# Patient Record
Sex: Female | Born: 2000 | Hispanic: No | Marital: Single | Smoking: Never smoker
Health system: Southern US, Community
[De-identification: ages and names within clinical notes are randomized; demographics above are authoritative.]

## PROBLEM LIST (undated history)

## (undated) DIAGNOSIS — F71 Moderate intellectual disabilities: Secondary | ICD-10-CM

## (undated) DIAGNOSIS — G479 Sleep disorder, unspecified: Secondary | ICD-10-CM

## (undated) DIAGNOSIS — R4689 Other symptoms and signs involving appearance and behavior: Secondary | ICD-10-CM

## (undated) HISTORY — DX: Moderate intellectual disabilities: F71

## (undated) HISTORY — DX: Other symptoms and signs involving appearance and behavior: R46.89

## (undated) HISTORY — DX: Sleep disorder, unspecified: G47.9

---
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
## 2019-01-04 ENCOUNTER — Ambulatory Visit (INDEPENDENT_AMBULATORY_CARE_PROVIDER_SITE_OTHER): Payer: Medicaid Other | Admitting: Pediatrics

## 2019-01-04 ENCOUNTER — Telehealth (INDEPENDENT_AMBULATORY_CARE_PROVIDER_SITE_OTHER): Payer: Self-pay

## 2019-01-04 DIAGNOSIS — R6250 Unspecified lack of expected normal physiological development in childhood: Secondary | ICD-10-CM

## 2019-01-04 DIAGNOSIS — Z8661 Personal history of infections of the central nervous system: Secondary | ICD-10-CM

## 2019-01-04 DIAGNOSIS — Q043 Other reduction deformities of brain: Secondary | ICD-10-CM

## 2019-01-04 DIAGNOSIS — F79 Unspecified intellectual disabilities: Secondary | ICD-10-CM

## 2019-01-04 DIAGNOSIS — G9349 Other encephalopathy: Secondary | ICD-10-CM

## 2019-01-04 NOTE — Telephone Encounter (Signed)
Call to [facility] at [facility]. She reports the only 2 beds Medicaid will cover are the Sleep Safe bed and a hospital bed. She reports to send the order and they will go out to see which mom wants. May not require PT eval if it is a hospital bed.   Fax [phone]

## 2019-01-04 NOTE — Telephone Encounter (Signed)
error 

## 2019-01-19 NOTE — Telephone Encounter (Signed)
Call back to [place] questioned if they are aware of any device to place on a seatbelt to prevent patient from unbuckling it and getting out of car. They reports only device they have is used with their wheelchair. Not aware of anything medicaid covers.  Spoke with Kendrick Fries to confirm receipt of fax related to bed order. She is not aware of receiving the order from 01/04/2019. RN resent order through Epic and will manually fax as well. Kendrick Fries to call RN back if she does not receive the order.

## 2019-02-05 ENCOUNTER — Ambulatory Visit (INDEPENDENT_AMBULATORY_CARE_PROVIDER_SITE_OTHER): Payer: Medicaid Other | Admitting: Pediatrics

## 2019-02-05 ENCOUNTER — Telehealth (INDEPENDENT_AMBULATORY_CARE_PROVIDER_SITE_OTHER): Payer: Self-pay | Admitting: Pediatrics

## 2019-02-05 NOTE — Telephone Encounter (Signed)
°  Who's calling (name and relationship to patient) : Lamar Laundry (Mother)  Best contact number: [phone] Provider they see: Dr. Rogers Blocker Reason for call: I placed call to mom to inform her that we needed to rs pt's appointment with Dr. Rogers Blocker. Appointment needs to be in the office and the patient needs to be with her. The appointment also needs to be a 60 min slot. Please also remind mom that pt needs to come in to have labs drawn before next visit.

## 2019-02-06 ENCOUNTER — Ambulatory Visit: Payer: Medicaid Other

## 2019-02-12 ENCOUNTER — Ambulatory Visit (INDEPENDENT_AMBULATORY_CARE_PROVIDER_SITE_OTHER): Payer: Medicaid Other | Admitting: Pediatrics

## 2019-02-14 ENCOUNTER — Other Ambulatory Visit (INDEPENDENT_AMBULATORY_CARE_PROVIDER_SITE_OTHER): Payer: Self-pay | Admitting: Pediatrics

## 2019-02-14 ENCOUNTER — Ambulatory Visit (INDEPENDENT_AMBULATORY_CARE_PROVIDER_SITE_OTHER): Payer: Medicaid Other | Admitting: Pediatrics

## 2019-02-14 DIAGNOSIS — R6250 Unspecified lack of expected normal physiological development in childhood: Secondary | ICD-10-CM

## 2019-02-20 ENCOUNTER — Telehealth (INDEPENDENT_AMBULATORY_CARE_PROVIDER_SITE_OTHER): Payer: Self-pay | Admitting: Pediatrics

## 2019-02-20 NOTE — Telephone Encounter (Signed)
That is fine to bring Misty [name] for labs before appointment.  Please review location and times of lab.  We did not put a referral in for endocrinology, I wonder if mother is thinking about Dr Henrene Pastor, as she was going to discuss birth control.  Please review with mother and if she wants an endocrinology referral, we will need to discuss that and I put an order in tomorrow.    Carylon Perches MD MPH

## 2019-02-20 NOTE — Telephone Encounter (Signed)
°  Who's calling (name and relationship to patient) : Lamar Laundry (Mother) Best contact number: [phone] Provider they see: Dr. Rogers Blocker  Reason for call: Mother stated she tried to get pt's blood drawn this week but had a hard time. Pt was non-compliant. Mom is going to bring pt to lab on Thursday before appointment. Mom wanted Dr. Rogers Blocker to know that she tried on three different occassions to get pt's blood drawn but it didn't work out. Mom also had questions about the endocrinology referral to see Dr. Baldo Ash as discussed.

## 2019-02-22 ENCOUNTER — Encounter (INDEPENDENT_AMBULATORY_CARE_PROVIDER_SITE_OTHER): Payer: Self-pay | Admitting: Pediatrics

## 2019-02-22 ENCOUNTER — Ambulatory Visit (INDEPENDENT_AMBULATORY_CARE_PROVIDER_SITE_OTHER): Payer: Medicaid Other | Admitting: Pediatrics

## 2019-02-22 ENCOUNTER — Other Ambulatory Visit: Payer: Self-pay

## 2019-02-22 VITALS — HR 88 | Ht 63.25 in | Wt 101.2 lb

## 2019-02-22 DIAGNOSIS — R4689 Other symptoms and signs involving appearance and behavior: Secondary | ICD-10-CM | POA: Diagnosis not present

## 2019-02-22 DIAGNOSIS — R6250 Unspecified lack of expected normal physiological development in childhood: Secondary | ICD-10-CM | POA: Diagnosis not present

## 2019-02-22 DIAGNOSIS — F902 Attention-deficit hyperactivity disorder, combined type: Secondary | ICD-10-CM

## 2019-02-22 DIAGNOSIS — G479 Sleep disorder, unspecified: Secondary | ICD-10-CM | POA: Diagnosis not present

## 2019-02-22 MED ORDER — CYPROHEPTADINE HCL 4 MG PO TABS: 4.0000 mg | ORAL_TABLET | Freq: Three times a day (TID) | ORAL | 0 refills | Status: DC | PRN

## 2019-02-22 MED ORDER — GABAPENTIN 300 MG PO CAPS: 900.0000 mg | ORAL_CAPSULE | Freq: Every day | ORAL | 3 refills | Status: DC

## 2019-02-22 NOTE — Patient Instructions (Addendum)
   Start cyproheptadine 4mg  before lunch, before dinner.   Increase gabapentin to 900mg .  Give at 9pm.   Go to the bed at 9:30-10pm.   Once in her room, she has to stay in her room.    Continue Intuniv 1 tablet in morning and 3 tablets at night.   Continue Abilify 10mg  at night and 2mg  as needed  Continue Fluoxetine 40mg  at night  Wendelyn Breslow will call to discuss high calorie foods.
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
## 2019-02-23 LAB — HEMOGLOBIN A1C
Hgb A1c MFr Bld: 4.8 % of total Hgb (ref <5.7)
Mean Plasma Glucose: 91 (calc)
eAG (mmol/L): 5 (calc)

## 2019-02-23 LAB — VITAMIN D 25 HYDROXY (VIT D DEFICIENCY, FRACTURES): Vit D, 25-Hydroxy: 33 ng/mL (ref 30–100)

## 2019-02-26 ENCOUNTER — Telehealth (INDEPENDENT_AMBULATORY_CARE_PROVIDER_SITE_OTHER): Payer: Self-pay

## 2019-02-26 NOTE — Telephone Encounter (Signed)
Message left for Misty [name] with [employer] to request a different type diaper or pull-up that does not have elastic around the leg. Current brand is rubbing her and causing redness. Her hips measured 34" at last OV

## 2019-02-28 ENCOUNTER — Telehealth (INDEPENDENT_AMBULATORY_CARE_PROVIDER_SITE_OTHER): Payer: Self-pay | Admitting: Pediatrics

## 2019-02-28 DIAGNOSIS — R569 Unspecified convulsions: Secondary | ICD-10-CM

## 2019-02-28 DIAGNOSIS — M4120 Other idiopathic scoliosis, site unspecified: Secondary | ICD-10-CM

## 2019-02-28 DIAGNOSIS — R32 Unspecified urinary incontinence: Secondary | ICD-10-CM

## 2019-02-28 DIAGNOSIS — Q043 Other reduction deformities of brain: Secondary | ICD-10-CM

## 2019-02-28 DIAGNOSIS — G9349 Other encephalopathy: Secondary | ICD-10-CM

## 2019-02-28 DIAGNOSIS — G479 Sleep disorder, unspecified: Secondary | ICD-10-CM

## 2019-02-28 DIAGNOSIS — G039 Meningitis, unspecified: Secondary | ICD-10-CM

## 2019-02-28 DIAGNOSIS — R6250 Unspecified lack of expected normal physiological development in childhood: Secondary | ICD-10-CM

## 2019-02-28 NOTE — Telephone Encounter (Signed)
  Who's calling (name and relationship to patient) : Kendrick Fries, [employer] ext [phone]   Best contact number: [phone]  Provider they see: Dr. Rogers Blocker   Reason for call: Would like to speak with Blair Heys, to follow up on a VM she received. Kendrick Fries states that the the bed is being referred to the wrong company, patient is not established with them. They don't mind doing it but wanted to notify us that they didn't do the last bed. Please call her when you get a chance.     PRESCRIPTION REFILL ONLY  Name of prescription:  Pharmacy:
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
## 2019-03-05 ENCOUNTER — Telehealth (INDEPENDENT_AMBULATORY_CARE_PROVIDER_SITE_OTHER): Payer: Self-pay | Admitting: Pediatrics

## 2019-03-05 NOTE — Telephone Encounter (Signed)
  Who's calling (name and relationship to patient) : Misty [name], mom  Best contact number: [phone]  Provider they see: Dr. Rogers Blocker   Reason for call: Mom is calling needing proof of disability for medicaid. Mom states medicaid is requesting this before 5pm on Wednesday 03/07/19, mom states she believes they are asking for this because it's a yearly thing she has to provide. Please write the letter and call mom, she will come to pick it up. If this letter is done tomorrow she can pick it up anytime, if it's done Wednesday she can pick it up in the morning before noon. Please advise.   PRESCRIPTION REFILL ONLY  Name of prescription:  Pharmacy:

## 2019-03-06 ENCOUNTER — Telehealth: Payer: Self-pay | Admitting: Pediatrics

## 2019-03-06 ENCOUNTER — Other Ambulatory Visit (INDEPENDENT_AMBULATORY_CARE_PROVIDER_SITE_OTHER): Payer: Self-pay | Admitting: Pediatrics

## 2019-03-06 DIAGNOSIS — G479 Sleep disorder, unspecified: Secondary | ICD-10-CM

## 2019-03-06 DIAGNOSIS — F902 Attention-deficit hyperactivity disorder, combined type: Secondary | ICD-10-CM

## 2019-03-06 NOTE — Telephone Encounter (Signed)
Mother is contacting us stating that medicaid is calling her stating that they need proof of disability for the child. She is requesting a letter stating this so that she may give it to medicaid. We may contact her at [phone] when the letter is completed and ready for pick-up.

## 2019-03-06 NOTE — Telephone Encounter (Signed)
Yes, that letter should suffice. Thanks.  Claudean Kinds, MD [facility] [place], [place] [address] Ph: [phone] Fax: [phone] 03/06/2019 1:26 PM

## 2019-03-06 NOTE — Telephone Encounter (Signed)
I called Mom to let her know that the letter was ready for her to pick up. TG

## 2019-03-06 NOTE — Telephone Encounter (Signed)
Letter was done by [employer] today; see encounter note with Ped Neuro dated 03/05/19.

## 2019-03-06 NOTE — Telephone Encounter (Signed)
Faxing information to [employer] for bed and seatbelt device- [phone]

## 2019-03-08 ENCOUNTER — Ambulatory Visit (INDEPENDENT_AMBULATORY_CARE_PROVIDER_SITE_OTHER): Payer: Medicaid Other | Admitting: Pediatrics

## 2019-03-08 ENCOUNTER — Encounter (INDEPENDENT_AMBULATORY_CARE_PROVIDER_SITE_OTHER): Payer: Self-pay

## 2019-03-08 ENCOUNTER — Encounter (INDEPENDENT_AMBULATORY_CARE_PROVIDER_SITE_OTHER): Payer: Self-pay | Admitting: Pediatrics

## 2019-03-08 ENCOUNTER — Other Ambulatory Visit: Payer: Self-pay

## 2019-03-08 VITALS — BP 106/66 | HR 108 | Ht 63.5 in | Wt 102.4 lb

## 2019-03-08 DIAGNOSIS — G479 Sleep disorder, unspecified: Secondary | ICD-10-CM | POA: Diagnosis not present

## 2019-03-08 DIAGNOSIS — Z5181 Encounter for therapeutic drug level monitoring: Secondary | ICD-10-CM | POA: Diagnosis not present

## 2019-03-08 DIAGNOSIS — F902 Attention-deficit hyperactivity disorder, combined type: Secondary | ICD-10-CM

## 2019-03-08 DIAGNOSIS — R6250 Unspecified lack of expected normal physiological development in childhood: Secondary | ICD-10-CM | POA: Diagnosis not present

## 2019-03-08 DIAGNOSIS — R4689 Other symptoms and signs involving appearance and behavior: Secondary | ICD-10-CM
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
## 2019-03-08 NOTE — Patient Instructions (Signed)
No change in medications Send a video to the phone number We will call you with bloodwork results Joint appointment at next visit for Launa Grill and Wendelyn Breslow

## 2019-03-09 ENCOUNTER — Telehealth (INDEPENDENT_AMBULATORY_CARE_PROVIDER_SITE_OTHER): Payer: Self-pay | Admitting: Pediatrics

## 2019-03-09 LAB — LIPID PANEL
Cholesterol: 113 mg/dL (ref <170)
HDL: 41 mg/dL — ABNORMAL LOW (ref >45)
LDL Cholesterol (Calc): 56 mg/dL (calc) (ref <110)
Non-HDL Cholesterol (Calc): 72 mg/dL (calc) (ref <120)
Total CHOL/HDL Ratio: 2.8 (calc) (ref <5.0)
Triglycerides: 80 mg/dL (ref <90)

## 2019-03-09 LAB — COMPREHENSIVE METABOLIC PANEL
AG Ratio: 1.6 (calc) (ref 1.0–2.5)
ALT: 8 U/L (ref 5–32)
AST: 11 U/L — ABNORMAL LOW (ref 12–32)
Albumin: 4.5 g/dL (ref 3.6–5.1)
Alkaline phosphatase (APISO): 59 U/L (ref 36–128)
BUN: 12 mg/dL (ref 7–20)
CO2: 27 mmol/L (ref 20–32)
Calcium: 10 mg/dL (ref 8.9–10.4)
Chloride: 105 mmol/L (ref 98–110)
Creat: 0.57 mg/dL (ref 0.50–1.00)
Globulin: 2.9 g/dL (calc) (ref 2.0–3.8)
Glucose, Bld: 79 mg/dL (ref 65–99)
Potassium: 4.3 mmol/L (ref 3.8–5.1)
Sodium: 141 mmol/L (ref 135–146)
Total Bilirubin: 0.9 mg/dL (ref 0.2–1.1)
Total Protein: 7.4 g/dL (ref 6.3–8.2)

## 2019-03-09 LAB — CBC WITH DIFFERENTIAL/PLATELET
Absolute Monocytes: 440 cells/uL (ref 200–950)
Basophils Absolute: 39 cells/uL (ref 0–200)
Basophils Relative: 0.7 %
Eosinophils Absolute: 39 cells/uL (ref 15–500)
Eosinophils Relative: 0.7 %
HCT: 39.5 % (ref 35.0–45.0)
Hemoglobin: 12.8 g/dL (ref 11.7–15.5)
Lymphs Abs: 2101 cells/uL (ref 850–3900)
MCH: 29.4 pg (ref 27.0–33.0)
MCHC: 32.4 g/dL (ref 32.0–36.0)
MCV: 90.8 fL (ref 80.0–100.0)
MPV: 10.4 fL (ref 7.5–12.5)
Monocytes Relative: 8 %
Neutro Abs: 2882 cells/uL (ref 1500–7800)
Neutrophils Relative %: 52.4 %
Platelets: 363 10*3/uL (ref 140–400)
RBC: 4.35 10*6/uL (ref 3.80–5.10)
RDW: 11.8 % (ref 11.0–15.0)
Total Lymphocyte: 38.2 %
WBC: 5.5 10*3/uL (ref 3.8–10.8)

## 2019-03-09 LAB — TSH+FREE T4: TSH W/REFLEX TO FT4: 1 mIU/L

## 2019-03-09 NOTE — Telephone Encounter (Signed)
I called mother and let her know of lab results. She verbalized agreement and understanding.

## 2019-03-09 NOTE — Telephone Encounter (Signed)
Please call mother and let her know labwork was normal, including kidney and liver markers.   Carylon Perches MD MPH

## 2019-03-13 ENCOUNTER — Ambulatory Visit: Payer: Medicaid Other | Admitting: Pediatrics

## 2019-03-13 DIAGNOSIS — R4689 Other symptoms and signs involving appearance and behavior: Secondary | ICD-10-CM

## 2019-03-13 NOTE — Progress Notes (Deleted)
THIS RECORD MAY CONTAIN CONFIDENTIAL INFORMATION THAT SHOULD NOT BE RELEASED WITHOUT REVIEW OF THE SERVICE PROVIDER.  Adolescent Medicine Consultation Initial Visit Misty [name]  is a 18 y.o. female referred by Ok Edwards, MD here today for evaluation of ***.      Review of records?  {Yes/No-Ex:120004}  Pertinent Labs? {Responses; yes/no/unknown/maybe/na:33144}  Growth Chart Viewed? {YES/NO/NOT APPLICABLE:20182}   History was provided by the {CHL AMB PERSONS; PED RELATIVES/OTHER W/PATIENT:[phone]}.   Team Care Documentation:  Team care documentation used during this visit? {YES/NO/WILD ZCHYI:50277} Team care members present and location: {Responses; yes/no/unknown/maybe/na:33144}  Chief complaint: ***  HPI:   PCP Confirmed?  {YES AJ:28786}    Patient's personal or confidential phone number: ***  ***  No LMP recorded.  Review of Systems:  ***  Allergies  Allergen Reactions  . Eggs Or Egg-Derived Products Other (See Comments)    Other reaction(s): HIVES   Current Outpatient Medications on File Prior to Visit  Medication Sig Dispense Refill  . ARIPiprazole (ABILIFY) 10 MG tablet Take 1 tablet (10 mg total) by mouth at bedtime. 90 tablet 3  . ARIPiprazole (ABILIFY) 2 MG tablet Take 1 tablet (2 mg total) by mouth 2 (two) times daily as needed. For agitation 60 tablet 5  . carbamide peroxide (DEBROX) 6.5 % otic solution Place 5 drops into both ears 2 (two) times daily. 15 mL 2  . cyproheptadine (PERIACTIN) 4 MG tablet Take 1 tablet (4 mg total) by mouth 3 (three) times daily as needed for allergies. 30 tablet 0  . FLUoxetine (PROZAC) 40 MG capsule Take 1 capsule (40 mg total) by mouth daily. 90 capsule 3  . gabapentin (NEURONTIN) 300 MG capsule Take 3 capsules (900 mg total) by mouth at bedtime. 180 capsule 3  . guanFACINE (INTUNIV) 1 MG TB24 ER tablet TAKE 1 TABLET BY MOUTH EVERY MORNING AND 3 TABLETS EVERY NIGHT AT BEDTIME 360 tablet 3  . hydrocortisone 2.5  % ointment Apply topically 2 (two) times daily as needed. For mild urticaria. Do not use for more than 1-2 weeks at a time. 20 g 1  . ibuprofen (ADVIL,MOTRIN) 400 MG tablet Give 400mg  before bed when on period 30 tablet 3  . PATADAY 0.2 % SOLN INT 1 GTT IN OU QD PRF ALLERGIES  5  . PAZEO 0.7 % SOLN ADMINISTER 1 DROP INTO BOTH EYES ONCE DAILY PRN  11  . polyethylene glycol powder (GLYCOLAX/MIRALAX) powder Take 17 g by mouth once. 500 g 4   No current facility-administered medications on file prior to visit.     Patient Active Problem List   Diagnosis Date Noted  . Attention deficit hyperactivity disorder (ADHD), combined type 07/24/2018  . Sleep disturbance 07/24/2018  . Repetitive movement 02/16/2018  . Developmental delay 02/16/2018  . Aggressive behavior of adolescent 07/14/2017  . Caregiver burden 03/10/2017  . Intellectual disability 01/21/2017  . Meningitis 01/21/2017  . Seizure ([place]) 01/21/2017  . Need for home health care 12/23/2016  . 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 Abnormality of gait 10/18/2014  . Postconcussion syndrome 10/18/2014  . History of psychosocial problem 05/08/2014  . Aggression 08/19/2013  . Constipation 08/19/2013  . Enuresis 08/19/2013  . 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 Scoliosis (and kyphoscoliosis), idiopathic 09/19/2012  . Acne 09/19/2012  Past Medical History:  Reviewed and updated?  {YES [id number] Past Medical History:  Diagnosis Date  . Behavior concern   . 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 Sleep disorder     Family History: Reviewed and updated? {YES [id number] Family History  Problem Relation Age of Onset  . Anxiety disorder Mother   . Fibromyalgia Mother   . Lumbar disc disease Mother   .  Hypothyroidism Sister     Social History:  School:  School: In Grade *** at [employer] Difficulties at school:  {YES/NO/WILD XKGYJ:85631} Future Plans:  {CHL AMB PED FUTURE SHFWY:[id number]}  Activities:  Special interests/hobbies/sports: ***  Lifestyle habits that can impact QOL: Sleep:*** Eating habits/patterns: *** Water intake: *** Exercise: ***  Confidentiality was discussed with the patient and if applicable, with caregiver as well.  Gender identity: *** Sex assigned at birth: *** Pronouns: {he/she/they:23295} Tobacco?  {YES/NO/WILD DXAJO:87867} Drugs/ETOH?  {YES/NO/WILD EHMCN:47096} Partner preference?  {CHL AMB PARTNER PREFERENCE:[phone]}  Sexually Active?  {YES/NO/WILD GEZMO:29476}  Pregnancy Prevention:  {Pregnancy [phone]} Reviewed condoms:  {YES/NO/WILD LYYTK:35465} Reviewed EC:  {YES/NO/WILD KCLEX:51700}   History or current traumatic events (natural disaster, house fire, etc.)? {YES/NO/WILD FVCBS:49675} History or current physical trauma?  {YES/NO/WILD FFMBW:46659} History or current emotional trauma?  {YES/NO/WILD DJTTS:17793} History or current sexual trauma?  {YES/NO/WILD JQZES:92330} History or current domestic or intimate partner violence?  {YES/NO/WILD QTMAU:63335} History of bullying:  {YES/NO/WILD KTGYB:63893}  Trusted adult at home/school:  {YES/NO/WILD CARDS:18581} Feels safe at home:  {YES/NO/WILD TDSKA:76811} Trusted friends:  {YES/NO/WILD XBWIO:03559} Feels safe at school:  {YES/NO/WILD RCBUL:84536}  Suicidal or homicidal thoughts?   {YES/NO/WILD IWOEH:21224} Self injurious behaviors?  {YES/NO/WILD MGNOI:37048} Guns in the home?  {YES/NO/WILD GQBVQ:94503}  {Common ambulatory SmartLinks:19316}  Physical Exam:  There were no vitals filed for this visit. There were no vitals taken for this visit. Body mass index: body mass index is unknown because there is no height or weight on file. Blood pressure percentiles are not  available for patients who are 18 years or older.  *** Physical Exam   Assessment/Plan: ***   BH screenings: *** reviewed and indicated ***. Screens discussed with patient and parent and adjustments to plan made accordingly.   Follow-up:   No follow-ups on file.   Medical decision-making:  >*** minutes spent face to face with patient with more than 50% of appointment spent discussing diagnosis, management, follow-up, and reviewing of ***.  CC: [name], MD, [name], MD

## 2019-03-13 NOTE — Progress Notes (Signed)
Significant communication difficulties virtually prevented [name] from completing this visit. Patient was rescheduled to next week in clinic. No charge.

## 2019-03-20 ENCOUNTER — Telehealth: Payer: Self-pay | Admitting: Psychologist

## 2019-03-20 ENCOUNTER — Encounter: Payer: Medicaid Other | Admitting: Licensed Clinical Social Worker

## 2019-03-20 ENCOUNTER — Other Ambulatory Visit: Payer: Self-pay

## 2019-03-20 ENCOUNTER — Ambulatory Visit: Payer: Medicaid Other

## 2019-03-21 ENCOUNTER — Ambulatory Visit: Payer: Medicaid Other | Admitting: Pediatrics

## 2019-03-23 ENCOUNTER — Telehealth (INDEPENDENT_AMBULATORY_CARE_PROVIDER_SITE_OTHER): Payer: Self-pay | Admitting: Pediatrics

## 2019-03-23 NOTE — Telephone Encounter (Signed)
Mother showed [name] on google maps where the site was that she wanted.  Judson Roch was present for that as well, she will need to call mom next week to problem solve again, as this is the second PT office we have referred to based on mother's description.    Carylon Perches MD MPH

## 2019-03-23 NOTE — Telephone Encounter (Signed)
°  Who's calling (name and relationship to patient) : [place] contact number: [phone] ext [phone] Provider they see: [facility] Blocker  Reason for call: LVM stating they received a referral for PT for patient.  They do not do PT there. Please call if more information is needed.     PRESCRIPTION REFILL ONLY  Name of prescription:  Pharmacy:

## 2019-03-26 NOTE — Telephone Encounter (Signed)
Call to [facility] spoke with Otila Kluver- She does confirm the order for the bed was received. She reports patient is listed as [facility] in their system. She will need a PT evaluation, office notes and they have the order. She reports her supervisor Raquel Sarna will contact family today to determine if they can do a virtual PT visit with their PT and then she will not have to go to an office. If she is not able to do this they will call our office back to place referral to PT for LMN.

## 2019-04-11 ENCOUNTER — Ambulatory Visit: Payer: Medicaid Other | Admitting: Pediatrics

## 2019-04-16 ENCOUNTER — Ambulatory Visit: Payer: Medicaid Other | Admitting: Pediatrics

## 2019-04-16 ENCOUNTER — Telehealth: Payer: Self-pay

## 2019-04-16 NOTE — Telephone Encounter (Signed)
Mom called to cancel appointment with Dr. Henrene Pastor tomorrow (12/15). She states she has new onset fever. Appointment cancelled. Called both numbers on file [phone] and [phone]. Both no answer and no vm option avail.

## 2019-04-17 ENCOUNTER — Encounter: Payer: Medicaid Other | Admitting: Licensed Clinical Social Worker

## 2019-04-17 ENCOUNTER — Ambulatory Visit: Payer: Medicaid Other

## 2019-04-17 NOTE — Telephone Encounter (Signed)
Sent staff message to Erasmo Downer to schedule:  "Could you call this mom at your convenience and schedule a follow up with Dr. Henrene Pastor onsite. She canceled the appointment that was originally scheduled for today d/t having a fever.   Thanks, Sharen Youngren,RN"

## 2019-04-23 NOTE — Telephone Encounter (Signed)
LVM for mom and rescheduled mom for next avail appt. Mom lvm asking for an appointment with Dr. Henrene Pastor on a Wednesday. Tried calling mom 3x and keep receiving a message that "call cannot go through." If/when mom calls back, please make her aware that the follow up with Dr. Henrene Pastor will need to be on a Tuesday, face to face in clinic, as Dr. Henrene Pastor is only here on Tuesday's. Routed to [facility] as an FYI in case call gets transferred to her again. Message can be sent to me as well if mom calls back. I will continue to try and reach her.

## 2019-05-17 ENCOUNTER — Ambulatory Visit: Payer: Medicaid Other | Admitting: Pediatrics

## 2019-05-22 ENCOUNTER — Ambulatory Visit: Payer: Medicaid Other

## 2019-05-29 ENCOUNTER — Telehealth: Payer: Self-pay | Admitting: Pediatrics

## 2019-05-29 NOTE — Telephone Encounter (Signed)

## 2019-05-30 ENCOUNTER — Ambulatory Visit: Payer: Medicaid Other | Admitting: Pediatrics

## 2019-06-07 ENCOUNTER — Telehealth: Payer: Self-pay | Admitting: Pediatrics

## 2019-06-07 NOTE — Telephone Encounter (Signed)
Forms placed in PCP box. Informed Burna Mortimer in front office that forms might need to be done by Dr [name] instead. If so, Burna Mortimer will notify mom.

## 2019-06-07 NOTE — Telephone Encounter (Signed)
Please call Mrs. Vernetta Honey as soon form is ready for pick up @ [phone]

## 2019-06-11 NOTE — Telephone Encounter (Signed)
Wanda left mom VM to pick up form.

## 2019-06-11 NOTE — Telephone Encounter (Signed)
Per written instructions on form, this is to be done by family and not physician. Form returned to front office to notify mother to pick up.

## 2019-06-22 ENCOUNTER — Telehealth: Payer: Self-pay | Admitting: Pediatrics

## 2019-06-22 NOTE — Telephone Encounter (Signed)
Pre-screening for onsite visit  1. Who is bringing the patient to the visit? Mom  Informed only one adult can bring patient to the visit to limit possible exposure to COVID19 and facemasks must be worn while in the building by the patient (ages 2 and older) and adult.  2. Has the person bringing the patient or the patient been around anyone with suspected or confirmed COVID-19 in the last 14 days? No   3. Has the person bringing the patient or the patient been around anyone who has been tested for COVID-19 in the last 14 days? {No   Is the person bringing the patient or the patient had any of these symptoms in the last 14 days? No   Fever (temp 100 F or higher) Breathing problems Cough Sore throat Body aches Chills Vomiting Diarrhea   If all answers are negative, advise patient to call our office prior to your appointment if you or the patient develop any of the symptoms listed above.   If any answers are yes, cancel in-office visit and schedule the patient for a same day telehealth visit with a provider to discuss the next steps.

## 2019-06-25 ENCOUNTER — Other Ambulatory Visit: Payer: Self-pay

## 2019-06-25 ENCOUNTER — Encounter: Payer: Self-pay | Admitting: Pediatrics

## 2019-06-25 ENCOUNTER — Ambulatory Visit (INDEPENDENT_AMBULATORY_CARE_PROVIDER_SITE_OTHER): Payer: Medicaid Other | Admitting: Pediatrics

## 2019-06-25 VITALS — BP 112/68 | HR 90 | Ht 63.11 in | Wt 101.2 lb

## 2019-06-25 DIAGNOSIS — R634 Abnormal weight loss: Secondary | ICD-10-CM

## 2019-06-25 DIAGNOSIS — Z0001 Encounter for general adult medical examination with abnormal findings: Secondary | ICD-10-CM | POA: Diagnosis not present

## 2019-06-25 DIAGNOSIS — R4689 Other symptoms and signs involving appearance and behavior: Secondary | ICD-10-CM

## 2019-06-25 DIAGNOSIS — Z742 Need for assistance at home and no other household member able to render care: Secondary | ICD-10-CM

## 2019-06-25 DIAGNOSIS — F79 Unspecified intellectual disabilities: Secondary | ICD-10-CM | POA: Diagnosis not present

## 2019-06-25 DIAGNOSIS — Z68.41 Body mass index (BMI) pediatric, 5th percentile to less than 85th percentile for age: Secondary | ICD-10-CM | POA: Diagnosis not present

## 2019-06-25 DIAGNOSIS — F919 Conduct disorder, unspecified: Secondary | ICD-10-CM

## 2019-06-25 DIAGNOSIS — Z00121 Encounter for routine child health examination with abnormal findings: Secondary | ICD-10-CM

## 2019-06-25 DIAGNOSIS — G479 Sleep disorder, unspecified: Secondary | ICD-10-CM | POA: Diagnosis not present
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
## 2019-06-25 NOTE — Patient Instructions (Signed)
Goals: Choose more whole grains, lean protein, low-fat dairy, and fruits/non-starchy vegetables. Aim for 60 min of moderate physical activity daily. Limit sugar-sweetened beverages and concentrated sweets. Limit screen time to less than 2 hours daily.  53210 5 servings of fruits/vegetables a day 3 meals a day, no meal skipping 2 hours of screen time or less 1 hour of vigorous physical activity Almost no sugar-sweetened beverages or foods    

## 2019-06-28 ENCOUNTER — Encounter: Payer: Self-pay | Admitting: Pediatrics

## 2019-06-28 DIAGNOSIS — R634 Abnormal weight loss: Secondary | ICD-10-CM | POA: Insufficient documentation

## 2019-06-29 ENCOUNTER — Other Ambulatory Visit: Payer: Self-pay

## 2019-06-29 ENCOUNTER — Ambulatory Visit (INDEPENDENT_AMBULATORY_CARE_PROVIDER_SITE_OTHER): Payer: Medicaid Other | Admitting: Pediatrics

## 2019-06-29 ENCOUNTER — Ambulatory Visit (INDEPENDENT_AMBULATORY_CARE_PROVIDER_SITE_OTHER): Payer: Medicaid Other

## 2019-06-29 ENCOUNTER — Encounter (INDEPENDENT_AMBULATORY_CARE_PROVIDER_SITE_OTHER): Payer: Self-pay | Admitting: Pediatrics

## 2019-06-29 VITALS — BP 112/68 | HR 96 | Ht 64.0 in | Wt 101.8 lb

## 2019-06-29 DIAGNOSIS — G9349 Other encephalopathy: Secondary | ICD-10-CM

## 2019-06-29 DIAGNOSIS — Z636 Dependent relative needing care at home: Secondary | ICD-10-CM

## 2019-06-29 DIAGNOSIS — R6250 Unspecified lack of expected normal physiological development in childhood: Secondary | ICD-10-CM

## 2019-06-29 DIAGNOSIS — Q043 Other reduction deformities of brain: Secondary | ICD-10-CM

## 2019-06-29 DIAGNOSIS — F902 Attention-deficit hyperactivity disorder, combined type: Secondary | ICD-10-CM | POA: Diagnosis not present

## 2019-06-29 DIAGNOSIS — G479 Sleep disorder, unspecified: Secondary | ICD-10-CM

## 2019-06-29 DIAGNOSIS — F79 Unspecified intellectual disabilities: Secondary | ICD-10-CM

## 2019-06-29 DIAGNOSIS — R4689 Other symptoms and signs involving appearance and behavior: Secondary | ICD-10-CM | POA: Diagnosis not present

## 2019-06-29 DIAGNOSIS — Z09 Encounter for follow-up examination after completed treatment for conditions other than malignant neoplasm: Secondary | ICD-10-CM

## 2019-06-29 DIAGNOSIS — R32 Unspecified urinary incontinence: Secondary | ICD-10-CM

## 2019-06-29 MED ORDER — ARIPIPRAZOLE 2 MG PO TABS: 2.0000 mg | ORAL_TABLET | Freq: Two times a day (BID) | ORAL | 3 refills | Status: DC | PRN

## 2019-06-29 MED ORDER — GABAPENTIN 300 MG PO CAPS: 900.0000 mg | ORAL_CAPSULE | Freq: Every day | ORAL | 3 refills | Status: DC

## 2019-06-29 MED ORDER — FLUOXETINE HCL 40 MG PO CAPS: 40.0000 mg | ORAL_CAPSULE | Freq: Every day | ORAL | 3 refills | Status: DC

## 2019-06-29 MED ORDER — ARIPIPRAZOLE 10 MG PO TABS: 10.0000 mg | ORAL_TABLET | Freq: Every day | ORAL | 3 refills | Status: DC

## 2019-06-29 MED ORDER — CYPROHEPTADINE HCL 4 MG PO TABS: 4.0000 mg | ORAL_TABLET | Freq: Three times a day (TID) | ORAL | 0 refills | Status: DC | PRN

## 2019-06-29 MED ORDER — MELATONIN 5 MG PO TABS: 10.0000 mg | ORAL_TABLET | Freq: Every day | ORAL | 3 refills | Status: DC

## 2019-06-29 MED ORDER — GUANFACINE HCL ER 1 MG PO TB24: ORAL_TABLET | ORAL | 3 refills | Status: DC

## 2019-06-29 NOTE — Patient Instructions (Addendum)
We will write for Pediasure.  Please give her 2 bottles daily.   Continue all medications.  I have sent new prescription for Abilify 2mg  as needed.
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
## 2019-07-02 ENCOUNTER — Other Ambulatory Visit: Payer: Self-pay

## 2019-07-02 ENCOUNTER — Telehealth (INDEPENDENT_AMBULATORY_CARE_PROVIDER_SITE_OTHER): Payer: Self-pay | Admitting: Pediatrics

## 2019-07-02 ENCOUNTER — Telehealth: Payer: Self-pay | Admitting: Pediatrics

## 2019-07-02 NOTE — Telephone Encounter (Signed)
Return call to mother- asking about the letter we need to send to [facility] for Arlyss to get [employer] Waiver services- Fax is [phone] letter needs

## 2019-07-02 NOTE — Telephone Encounter (Signed)
  Who's calling (name and relationship to patient) : Vernetta Honey - Mom   Best contact number: [phone]  Provider they see: Dr [name]   Reason for call: Mom called to speak with [name] directly about Misty [name]. No further details given.     PRESCRIPTION REFILL ONLY  Name of prescription:  Pharmacy:

## 2019-07-02 NOTE — Telephone Encounter (Signed)
Called mom on 3.1.21 to offer next available appt with Dr. Marina Goodell. Parent did not answer but did leave VM to give [name] a call back.
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
## 2019-07-03 NOTE — Telephone Encounter (Signed)
Left message for mom RN scheduled appointment with Dr. Marina Goodell on April 20 at 1:30 pm.  Will call her back when letter is complete

## 2019-07-04 NOTE — Telephone Encounter (Signed)
I agree with this letter, I will also confer with Elveria Rising NP as she has written these letters before.   Lorenz Coaster MD MPH

## 2019-07-05 ENCOUNTER — Telehealth: Payer: Self-pay

## 2019-07-05 NOTE — Progress Notes (Addendum)
Mom requesting a letter be sent to [facility] at [phone] requesting B3 services and a new case manager. She would like to use [facility] for the innovations waiver services. She again ask about the bed. RN advised the bed cannot be ordered until she completes the PT evaluation.  Mom reports she has not heard from Dr. Lamar Sprinkles office about the appt. RN called and was told we have to enter a referral for Dr. Marina Goodell. After visit RN noted they tried to call mom and had to leave a message for her.  Mom would also like a copy of the letter mailed to her home address. RN advised once completed it will be faxed and mailed.  RN called [facility] and was told there is a form and they will need notes to start services.  Duwayne Heck, number is [phone]

## 2019-07-05 NOTE — Telephone Encounter (Signed)
Mom would like a call back to reschedule pts appt with Dr Marina Goodell. Good luck.

## 2019-07-06 ENCOUNTER — Telehealth (INDEPENDENT_AMBULATORY_CARE_PROVIDER_SITE_OTHER): Payer: Self-pay

## 2019-07-06 NOTE — Telephone Encounter (Signed)
Letter faxed to [employer] and copy placed in the outgoing mail to send to mom.

## 2019-07-06 NOTE — Telephone Encounter (Signed)
Call to mom- advised letter she requested was faxed to [facility], copy of letter placed in outgoing mail yesterday. She keeps asking if RN faxed to the "other lady" RN tries to clarify and thinks she is referring to [employer] company. RN advised no it was her understanding that it needed to go to [facility] first, be assigned a new case co-ordinator and then send required info to [facility]. Mom confirms this is correct- RN advised therefore the letter was not faxed to them only to [facility]. Mom wants RN to call and speak with person at [facility] about a case co-ordinator. RN advised she was told they are working remotely and therefore may not actually see the fax before next week. Advised Mom needs to call them next week to confirm receipt and discuss next steps. If they need more information they can contact our office at that time. RN advised mom she needs to keep the appointment with Dr. Marina Goodell on 4/20 at 1:30 that was her next available appointment and she will not have anything sooner unless there is a cancellation. Advised RN will request they put her on a cancellation list if that is possible but it is doubtful they will be able to see her any sooner. RN advised she needs to discuss Vickki's heavy menses with Dr. Marina Goodell and see if starting birth control would help her tolerate her menstrual cycles better. Mom states understanding of all information.

## 2019-07-12 ENCOUNTER — Telehealth: Payer: Self-pay

## 2019-07-12 NOTE — Telephone Encounter (Signed)
Mom would like a call back. She will keep calling until she gets a return call. Thank you.

## 2019-07-12 NOTE — Telephone Encounter (Signed)
Called mom to see if I could offer assistance or deliver any info to Dr [name]. This is in regards to a letter she would like to send to 2 locations: [facility] and [employer]. Letters can be identical. In need of supporting details why mom should be caregiver, now that patient is over age 20. RN will be able to talk with MD on Monday. Mom would like to pick up letters by Tuesday.

## 2019-07-16 ENCOUNTER — Encounter: Payer: Self-pay | Admitting: Pediatrics

## 2019-07-16 ENCOUNTER — Telehealth (INDEPENDENT_AMBULATORY_CARE_PROVIDER_SITE_OTHER): Payer: Medicaid Other | Admitting: Pediatrics
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
## 2019-07-17 NOTE — Telephone Encounter (Signed)
Error

## 2019-07-17 NOTE — Telephone Encounter (Signed)
Letters completed by PCP and brought to front office to notify the mother to pick up.

## 2019-07-20 ENCOUNTER — Ambulatory Visit (INDEPENDENT_AMBULATORY_CARE_PROVIDER_SITE_OTHER): Payer: Medicaid Other | Admitting: Pediatrics

## 2019-07-20 ENCOUNTER — Telehealth (INDEPENDENT_AMBULATORY_CARE_PROVIDER_SITE_OTHER): Payer: Self-pay

## 2019-07-20 NOTE — Telephone Encounter (Signed)
Call to above number listed in chart as [place] that mom gave RN. Number is to [employer] office RN spoke to [place] and she advised to call Elliot Cousin with [place] at [phone] to follow up on where to fax the letter to.  Call to [facility] left message requesting call back to confirm where to fax the letter for patient to obtain B3 services.  Return call from Victorino Dike- she reports to fax to [phone]- Advised original fax also has that it was received but was to a [phone] number Letter refaxed and confirmation received  Left message for mom with the above information advised will let her know if RN hears from [place]. Advised she must keep the appointment with Dr. Marina Goodell as scheduled.

## 2019-07-23 ENCOUNTER — Telehealth (INDEPENDENT_AMBULATORY_CARE_PROVIDER_SITE_OTHER): Payer: Self-pay | Admitting: Pediatrics

## 2019-07-23 NOTE — Telephone Encounter (Signed)
Called patient's family and left voicemail for family to return my call when possible.   

## 2019-07-23 NOTE — Telephone Encounter (Signed)
  Who's calling (name and relationship to patient) : Misty [name] mom   Best contact number: [phone]  Provider they see: Dr. [name]   Reason for call: Mom is requesting a call back from [place].     PRESCRIPTION REFILL ONLY  Name of prescription:  Pharmacy:

## 2019-07-25 ENCOUNTER — Telehealth (INDEPENDENT_AMBULATORY_CARE_PROVIDER_SITE_OTHER): Payer: Self-pay | Admitting: Pediatrics

## 2019-07-25 NOTE — Telephone Encounter (Signed)
Who's calling (name and relationship to patient) : [name] (mom)  Best contact number: [phone]  Provider they see: Dr. [name]  Reason for call:  Mom called in requesting to speak to both Faby and [name]. States she did not receive Faby's voicemail per previous phone note. Please advise  Call ID:      PRESCRIPTION REFILL ONLY  Name of prescription:  Pharmacy:

## 2019-07-25 NOTE — Telephone Encounter (Signed)
I called patient's mother back and she wanted me to ask Maralyn Sago T to please call her back with an update for [facility] and [employer]. I let mother know that per Sarah's last note they requested more information on Friday 03/19 that Maralyn Sago has already faxed. I advised her that they were most likely still processing said paperwork and that I would advise Maralyn Sago that mother was calling in for an update.   Mother inquired on COVID-19 vaccine for Felisa and per Dr. [name] she recommended Courtlynn get the vaccine.   Mother verbalized agreement and understanding. I reviewed future appointments in the system that are scheduled with Dr. [name] and Dr. Marina Goodell with her.

## 2019-07-25 NOTE — Telephone Encounter (Signed)
Please refer to next phone note for follow-up on this.

## 2019-07-27 NOTE — Telephone Encounter (Signed)
Call to [facility] with [facility] to return her call from yesterday- she called but only left message - RN left message for her to determine if she received the form that was faxed last week.  Call to mom- advised as above. She denies receiving a call from her. Advised once she sees Dr. Marina Goodell that information will need to be sent to them as well. appt 4/20. Mom reports she has forms that need to be faxed back to them but she does not have access to fax- RN advised if it does not require Dr. [name] to sign she can fax information to them. Mom will bring on Monday to [facility] office where her other daughter is seen

## 2019-07-31 ENCOUNTER — Telehealth (INDEPENDENT_AMBULATORY_CARE_PROVIDER_SITE_OTHER): Payer: Self-pay
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
## 2019-08-06 ENCOUNTER — Encounter (INDEPENDENT_AMBULATORY_CARE_PROVIDER_SITE_OTHER): Payer: Self-pay | Admitting: Pediatrics

## 2019-08-16 ENCOUNTER — Telehealth (INDEPENDENT_AMBULATORY_CARE_PROVIDER_SITE_OTHER): Payer: Self-pay | Admitting: Pediatrics

## 2019-08-16 NOTE — Telephone Encounter (Signed)
Who's calling (name and relationship to patient) : Vernetta Honey  Mom   Best contact number: [phone]  Provider they see: Dr. [name]    Reason for call: Mom requesting to speak with [name].   Mom would not volunteer any information when asked what the call was in reference to.   Call ID:      PRESCRIPTION REFILL ONLY  Name of prescription:  Pharmacy:

## 2019-08-17 NOTE — Telephone Encounter (Signed)
Message sent to [employer] to determine reason she has not received incontinence supplies

## 2019-08-17 NOTE — Telephone Encounter (Signed)
  Who's calling (name and relationship to patient) :Deniece Portela (Mom)  Best contact number: [phone]  Provider they see: Dr. [name]  Reason for call: Mom called asking for Maralyn Sago, she wanted to let her know that Lynnley has not received diapers for the past two months. She would like a call back today.

## 2019-08-20 ENCOUNTER — Ambulatory Visit (INDEPENDENT_AMBULATORY_CARE_PROVIDER_SITE_OTHER): Payer: Medicaid Other | Admitting: Pediatrics
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
## 2019-08-21 ENCOUNTER — Ambulatory Visit (INDEPENDENT_AMBULATORY_CARE_PROVIDER_SITE_OTHER): Payer: Medicaid Other | Admitting: Pediatrics

## 2019-08-21 ENCOUNTER — Other Ambulatory Visit: Payer: Self-pay

## 2019-08-21 VITALS — BP 108/69 | HR 101 | Wt 102.4 lb

## 2019-08-21 DIAGNOSIS — N939 Abnormal uterine and vaginal bleeding, unspecified: Secondary | ICD-10-CM

## 2019-08-21 DIAGNOSIS — Z3009 Encounter for other general counseling and advice on contraception: Secondary | ICD-10-CM

## 2019-08-21 DIAGNOSIS — R4689 Other symptoms and signs involving appearance and behavior: Secondary | ICD-10-CM

## 2019-08-21 NOTE — Telephone Encounter (Signed)
Per [employer] rep Jeannett Senior: There may have been some miscommunication due to language barrier with this pt. However, according to my notes, she did receive supplies for March order. She requested we switch from a "Prevail" product to a "TENA" product due to skin irritation and breakdown. We shipped a sample pack out earlier this month and just spoke with mom on 4/13 and 4/14. The issue was that she confirmed through out patient portal that she wanted a resupply order of the same supplies, so now after speaking with her, she changed to the TENA brand. We did a re-ship and package pick up of the original order that was shipped for April to the pt. We should have it all ironed out now, but if I am needing any addional paperwork, I'll email or call you with that request. Anise Salvo
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
## 2019-08-21 NOTE — Patient Instructions (Addendum)
Pleasure meeting [name], today!  She was seen for heavy menstrual bleeding. We are doing blood tests to see if there is a reason for her heavy bleeding.   We also discussed reasons for birth control today and the different forms. There are options that will continue to allow her to have her period while preventing pregnancy. Below is a review of the common contraception choices again.   We will see Misty [name] back again in 3 weeks to discuss her results.  Contraception Choices Contraception, also called birth control, means things to use or ways to try not to get pregnant. Hormonal birth control This kind of birth control uses hormones. Here are some types of hormonal birth control:  A tube that is put under skin of the arm (implant). The tube can stay in for as long as 3 years.  Shots to get every 3 months (injections).  Pills to take every day (birth control pills).  A patch to change 1 time each week for 3 weeks (birth control patch). After that, the patch is taken off for 1 week.  A ring to put in the vagina. The ring is left in for 3 weeks. Then it is taken out of the vagina for 1 week. Then a new ring is put in.  Pills to take after unprotected sex (emergency birth control pills).  IUD (intrauterine) birth control An IUD is a small, T-shaped piece of plastic. It is put inside the uterus. There are two kinds:  Hormone IUD. This kind can stay in for 3-5 years.  Copper IUD. This kind can stay in for 10 years. Permanent birth control Here are some types of permanent birth control:  Surgery to block the fallopian tubes.  Having an insert put into each fallopian tube.  Surgery to tie off the tubes that carry sperm (vasectomy).  Summary  Contraception, also called birth control, means things to use or ways to try not to get pregnant.  Hormonal methods of birth control include implants, injections, pills, patches, vaginal rings, and emergency birth control pills.  Barrier  methods of birth control can include female condoms, female condoms, diaphragms, cervical caps, sponges, and spermicides.  There are two types of IUD (intrauterine device) birth control. An IUD can be put in a woman's uterus to prevent pregnancy for 3-5 years.  Permanent sterilization can be done through a procedure for males, females, or both.  Natural planning methods involve not having sex on the days when the woman could get pregnant. This information is not intended to replace advice given to you by your health care provider. Make sure you discuss any questions you have with your health care provider. Document Revised: 08/09/2018 Document Reviewed: 04/29/2016 Elsevier Patient Education  2020 [employer].

## 2019-08-22 ENCOUNTER — Ambulatory Visit (INDEPENDENT_AMBULATORY_CARE_PROVIDER_SITE_OTHER): Payer: Medicaid Other | Admitting: Pediatrics

## 2019-08-22 ENCOUNTER — Encounter (INDEPENDENT_AMBULATORY_CARE_PROVIDER_SITE_OTHER): Payer: Self-pay | Admitting: Pediatrics

## 2019-08-22 VITALS — Ht 64.0 in | Wt 102.2 lb

## 2019-08-22 DIAGNOSIS — G479 Sleep disorder, unspecified: Secondary | ICD-10-CM

## 2019-08-22 DIAGNOSIS — R6250 Unspecified lack of expected normal physiological development in childhood: Secondary | ICD-10-CM

## 2019-08-22 DIAGNOSIS — Q043 Other reduction deformities of brain: Secondary | ICD-10-CM

## 2019-08-22 DIAGNOSIS — R3981 Functional urinary incontinence: Secondary | ICD-10-CM

## 2019-08-22 DIAGNOSIS — Z636 Dependent relative needing care at home: Secondary | ICD-10-CM | POA: Diagnosis not present

## 2019-08-22 DIAGNOSIS — E441 Mild protein-calorie malnutrition: Secondary | ICD-10-CM

## 2019-08-22 DIAGNOSIS — F902 Attention-deficit hyperactivity disorder, combined type: Secondary | ICD-10-CM

## 2019-08-22 DIAGNOSIS — R4689 Other symptoms and signs involving appearance and behavior: Secondary | ICD-10-CM

## 2019-08-22 LAB — PROTIME-INR
INR: 1.2 — ABNORMAL HIGH
Prothrombin Time: 12.3 s — ABNORMAL HIGH (ref 9.0–11.5)

## 2019-08-22 LAB — ABO AND RH

## 2019-08-22 MED ORDER — ARIPIPRAZOLE 2 MG PO TABS: 2.0000 mg | ORAL_TABLET | Freq: Two times a day (BID) | ORAL | 3 refills | Status: DC | PRN

## 2019-08-22 MED ORDER — FLUOXETINE HCL 40 MG PO CAPS: 40.0000 mg | ORAL_CAPSULE | Freq: Every day | ORAL | 3 refills | Status: DC

## 2019-08-22 MED ORDER — GUANFACINE HCL ER 1 MG PO TB24: ORAL_TABLET | ORAL | 3 refills | Status: DC

## 2019-08-22 MED ORDER — ENSURE PO LIQD: 1.0000 | Freq: Every day | ORAL | 3 refills | Status: DC

## 2019-08-22 MED ORDER — IRON 325 (65 FE) MG PO TABS: 325.0000 mg | ORAL_TABLET | Freq: Every day | ORAL | 11 refills | Status: DC

## 2019-08-22 MED ORDER — CYPROHEPTADINE HCL 4 MG PO TABS: 4.0000 mg | ORAL_TABLET | Freq: Three times a day (TID) | ORAL | 3 refills | Status: DC

## 2019-08-22 MED ORDER — GABAPENTIN 300 MG PO CAPS: 900.0000 mg | ORAL_CAPSULE | Freq: Every day | ORAL | 3 refills | Status: DC

## 2019-08-22 MED ORDER — ARIPIPRAZOLE 10 MG PO TABS: 10.0000 mg | ORAL_TABLET | Freq: Every day | ORAL | 3 refills | Status: DC

## 2019-08-23 MED ORDER — ENSURE PO LIQD: 1.0000 | Freq: Two times a day (BID) | ORAL | 3 refills | Status: DC

## 2019-08-24 LAB — VON WILLEBRAND PANEL
Factor-VIII Activity: 153 % normal (ref 50–180)
Ristocetin Co-Factor: 61 % normal (ref 42–200)
Von Willebrand Antigen, Plasma: 75 % (ref 50–217)
aPTT: 30 s (ref 23–32)

## 2019-08-24 LAB — FERRITIN: Ferritin: 15 ng/mL — ABNORMAL LOW (ref 16–154)

## 2019-08-24 LAB — PROLACTIN: Prolactin: 11.3 ng/mL

## 2019-09-03 ENCOUNTER — Telehealth: Payer: Self-pay | Admitting: Pediatrics

## 2019-09-03 ENCOUNTER — Other Ambulatory Visit (INDEPENDENT_AMBULATORY_CARE_PROVIDER_SITE_OTHER): Payer: Self-pay | Admitting: Pediatrics

## 2019-09-03 ENCOUNTER — Encounter (INDEPENDENT_AMBULATORY_CARE_PROVIDER_SITE_OTHER): Payer: Self-pay

## 2019-09-03 DIAGNOSIS — F79 Unspecified intellectual disabilities: Secondary | ICD-10-CM

## 2019-09-03 DIAGNOSIS — G9349 Other encephalopathy: Secondary | ICD-10-CM

## 2019-09-03 DIAGNOSIS — R32 Unspecified urinary incontinence: Secondary | ICD-10-CM

## 2019-09-03 NOTE — Telephone Encounter (Signed)

## 2019-09-04 ENCOUNTER — Ambulatory Visit (INDEPENDENT_AMBULATORY_CARE_PROVIDER_SITE_OTHER): Payer: Medicaid Other | Admitting: Pediatrics

## 2019-09-04 ENCOUNTER — Telehealth: Payer: Self-pay | Admitting: Psychologist

## 2019-09-04 ENCOUNTER — Other Ambulatory Visit: Payer: Self-pay

## 2019-09-04 DIAGNOSIS — F919 Conduct disorder, unspecified: Secondary | ICD-10-CM | POA: Diagnosis not present

## 2019-09-04 DIAGNOSIS — F79 Unspecified intellectual disabilities: Secondary | ICD-10-CM

## 2019-09-04 DIAGNOSIS — Z638 Other specified problems related to primary support group: Secondary | ICD-10-CM

## 2019-09-04 DIAGNOSIS — Z636 Dependent relative needing care at home: Secondary | ICD-10-CM | POA: Diagnosis not present

## 2019-09-04 NOTE — Telephone Encounter (Signed)
From caroline: CAP/IDD worker for [employer]: [employer] [phone]  TC to mom, she did not answer, so I LVM letting her know that I am scheduling the next available evaluation, which will consist of several appointments. Confirmed with [facility] that mom does specifically need 1:30pm appointments. Mom has reported this to me before as well. Stated in the VM that I can mail or email paperwork, or ensure that either myself or someone else will be available to work one on one with mom on the paperwork when she is on site for follow up with Dr. Marina Goodell next month. Family has a lot of barriers so may need the one on one assistance. Per previous message, Britta Mccreedy will assist with follow up regarding records from the school and I will refax the consent to request records once an updated one is received from mom. If mom wants one on one help with the paperwork, I will have her sign the consent then.

## 2019-09-04 NOTE — Telephone Encounter (Signed)
Misty [name], please call mom to follow-up with referral and find out what school she is attending and get ROI signed. I will follow-up with school myself to get records. Previous evaluations will be crucial in her case.

## 2019-09-04 NOTE — Progress Notes (Signed)
History was provided by the mother.  Misty [name] is a 19 y.o. female who is here for lab follow-up.  Ok Edwards, MD   HPI: Mother does not have any concerns or questions regarding labs obtained for heavy periods. Labs obtained 08/22/19 reassuring and without evidence of pathological bleeding disorder.  Mother's main concern today was obtaining a letter to help Misty [name] get an [employer] Waiver so that mother can have more assistance as Misty [name]'s primary caregiver. Misty [name] depends on her mother to perform her daily activities including feeding, clothing and diapering. She describes it as if they like they are one person because she has to be with Misty [name] all the time. As a result, she cannot work and takes care of Misty [name] full-time.   Mother was appropriately tearful during visit. She also expressed difficulty in navigating legal system to obtain her status as caregiver and obtain an Tax adviser for Misty [name].   Patient Active Problem List   Diagnosis Date Noted  . Weight loss 06/28/2019  . Attention deficit hyperactivity disorder (ADHD), combined type 07/24/2018  . Sleep disturbance 07/24/2018  . Repetitive movement 02/16/2018  . Developmental delay 02/16/2018  . Aggressive behavior of adolescent 07/14/2017  . Caregiver burden 03/10/2017  . Intellectual disability 01/21/2017  . Meningitis 01/21/2017  . Seizure ([facility]) 01/21/2017  . Need for home health care 12/23/2016  . 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 Abnormality of gait 10/18/2014  . Postconcussion syndrome 10/18/2014  . History of psychosocial problem 05/08/2014  . Aggression 08/19/2013  . Constipation 08/19/2013  . Enuresis 08/19/2013  . 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  Scoliosis (and kyphoscoliosis), idiopathic 09/19/2012  . Acne 09/19/2012    Current Outpatient Medications on File Prior to Visit  Medication Sig Dispense Refill  . ARIPiprazole (ABILIFY) 10 MG tablet Take 1 tablet (10 mg total) by mouth at bedtime. 90 tablet 3  . ARIPiprazole (ABILIFY) 2 MG tablet Take 1 tablet (2 mg total) by mouth 2 (two) times daily as needed (for aggression). 30 tablet 3  . cyproheptadine (PERIACTIN) 4 MG tablet Take 1 tablet (4 mg total) by mouth 3 (three) times daily. 90 tablet 3  . Ensure (ENSURE) Take 1 Can by mouth 2 (two) times daily between meals. 14220 mL 3  . Ferrous Sulfate (IRON) 325 (65 Fe) MG TABS Take 1 tablet (325 mg total) by mouth daily. 30 tablet 11  . FLUoxetine (PROZAC) 40 MG capsule Take 1 capsule (40 mg total) by mouth daily. 90 capsule 3  . gabapentin (NEURONTIN) 300 MG capsule Take 3 capsules (900 mg total) by mouth at bedtime. 180 capsule 3  . guanFACINE (INTUNIV) 1 MG TB24 ER tablet TAKE 1 TABLET BY MOUTH EVERY MORNING AND 3 TABLETS EVERY NIGHT AT BEDTIME 360 tablet 3  . hydrocortisone 2.5 % ointment Apply topically 2 (two) times daily as needed. For mild urticaria. Do not use for more than 1-2 weeks at a time. 20 g 1  . Melatonin 5 MG TABS Take 2 tablets (10 mg total) by mouth at bedtime. 60 tablet 3  . polyethylene glycol powder (GLYCOLAX/MIRALAX) powder Take 17 g by mouth once. 500 g 4   No current facility-administered medications on file prior to visit.    Allergies  Allergen Reactions  . Eggs Or Egg-Derived Products  Other (See Comments)    Other reaction(s): HIVES      Physical Exam:    Physical Exam Constitutional:      General: She is not in acute distress.    Comments: Well-appearing, smiling, developmentally delayed and non-verbal; tearing pieces of magazine paper but calm and happy  HENT:     Nose: Nose normal.  Cardiovascular:     Rate and Rhythm: Normal rate and regular rhythm.     Heart sounds: No murmur.   Pulmonary:     Effort: Pulmonary effort is normal.     Breath sounds: Normal breath sounds. No wheezing.  Neurological:     Mental Status: She is alert.     Comments: Developmentally delayed, understands commands/questions and able to gesture for needs     Assessment/Plan: Disruptive behavior disorder  Intellectual disability  Caregiver burden  Parental concern about child  - Provided signed letter stating Misty [name]'s need for caregiver assistance and community resources for adults with intellectual disability - Will refer for neuropsychological testing to document baseline functionality and to determine if unifying diagnosis can be identified - Will provide resources for Legal Aid for mother to navigate court system in obtaining [employer] Waiver and other services - consider obtaining coagulation factors and ferritin if bleeding continues to be issue and/or worsens - Follow-up June 1st with Dr. Marina Goodell  >50% of the 35 minute visit was spent in face-to-face counseling and coordination of care regarding disruptive behavior and caregiver burden.   Horace Porteous, MD   Patient was discussed with Dr. Delorse Lek

## 2019-09-06 NOTE — Telephone Encounter (Signed)
TC to mom again to follow up. Phone range 2x then was forwarded to VM. LVM asking for mom to call back for confirmation and to let me know what works best for the paperwork.

## 2019-09-13 NOTE — Telephone Encounter (Signed)
TC to mom, as I have left a couple of messages over the last two weeks and have not been able to make contact with her. Left another voicemail this morning, asking for her to give a call back to discuss/confirm the evaluation series that is scheduled for [place], and also to make a plan for the new patient forms that will need to be completed.

## 2019-09-24 ENCOUNTER — Telehealth: Payer: Self-pay | Admitting: Pediatrics

## 2019-09-24 NOTE — Telephone Encounter (Signed)
Mom brought form to be filled out mom was informed of policy and expressed consent. Please call mom when form is ready.

## 2019-09-24 NOTE — Telephone Encounter (Signed)
No luck with either. I left a message for the CAP worker about a week and a half ago but haven't heard back, and haven't made contact with mom yet either.

## 2019-09-25 NOTE — Telephone Encounter (Signed)
Form - [facility] Level of Care Request Worksheet placed in Dr. Lonie Peak folder.

## 2019-09-26 NOTE — Telephone Encounter (Signed)
Kanchan's Mom called to notify us that specialist does not need to fill out form. It is to be completed by PCP. Form is currently in Dr. Lonie Peak folder.  Mom requested call. Attempted to contact her at [phone] but mailbox was full.

## 2019-09-26 NOTE — Telephone Encounter (Addendum)
Dr. [name] completed the form. Melissa made copies and placed it at front desk for pick up. Mom notified.

## 2019-10-02 ENCOUNTER — Telehealth: Payer: Self-pay | Admitting: Psychologist

## 2019-10-02 ENCOUNTER — Other Ambulatory Visit: Payer: Self-pay

## 2019-10-02 ENCOUNTER — Ambulatory Visit (INDEPENDENT_AMBULATORY_CARE_PROVIDER_SITE_OTHER): Payer: Medicaid Other | Admitting: Pediatrics

## 2019-10-02 DIAGNOSIS — Z636 Dependent relative needing care at home: Secondary | ICD-10-CM | POA: Diagnosis not present

## 2019-10-02 DIAGNOSIS — F919 Conduct disorder, unspecified: Secondary | ICD-10-CM

## 2019-10-02 NOTE — Telephone Encounter (Signed)
Mom has no knowledge of any testing or evaluations. Per mom, someone in the [facility] system did an evaluation "several years ago", but she is not sure who or where. I will review the chart to see what I can find.

## 2019-10-02 NOTE — Patient Instructions (Signed)
[facility] Legal Aid [phone]

## 2019-10-02 NOTE — Progress Notes (Signed)
THIS RECORD MAY CONTAIN CONFIDENTIAL INFORMATION THAT SHOULD NOT BE RELEASED WITHOUT REVIEW OF THE SERVICE PROVIDER.  Adolescent Medicine Consultation Follow-Up Visit Verla Willis Modena Clovia Cuff  is a 19 y.o. female referred by Ok Edwards, MD here today for follow-up of caregiver assistance in the setting of Sera's aggressive behavior and intellectual disability.   Previsit planning completed:  yes  Growth Chart Viewed? yes   History was provided by the mother.  PCP Confirmed?  yes  My Chart Activated?   yes   HPI:    At Chelle's last visit on 09/04/19, a signed letter was provided stating Staria's need for caregiver assistance. Resources were additionally provided for Legal Aid to assist mom in navigating the court system to obtain an Tax adviser and other services. A referral was also placed for neuropsychological testing to document Murel's baseline functionality and to determine if a unifying diagnosis can be identified.  Today Mom states that she is still having difficulties with Sua's behavior. Elenna sometimes wakes up at night and goes to the bathroom or to the kitchen, mom has to wake up with her so that Benna does not turn on the tub or get into the kitchen supplies.  Mom's primary concern today is to seek additional assistance in obtaining the [employer] Waiver. She would like a letter detailing Shealyn's condition and would like stronger language that better describes Meerab's aggressive behavior.   No LMP recorded. Allergies  Allergen Reactions  . Eggs Or Egg-Derived Products Other (See Comments)    Other reaction(s): HIVES   Outpatient Medications Prior to Visit  Medication Sig Dispense Refill  . ARIPiprazole (ABILIFY) 10 MG tablet Take 1 tablet (10 mg total) by mouth at bedtime. 90 tablet 3  . ARIPiprazole (ABILIFY) 2 MG tablet Take 1 tablet (2 mg total) by mouth 2 (two) times daily as needed (for aggression). 30 tablet 3  . cyproheptadine  (PERIACTIN) 4 MG tablet Take 1 tablet (4 mg total) by mouth 3 (three) times daily. 90 tablet 3  . Ensure (ENSURE) Take 1 Can by mouth 2 (two) times daily between meals. 14220 mL 3  . Ferrous Sulfate (IRON) 325 (65 Fe) MG TABS Take 1 tablet (325 mg total) by mouth daily. 30 tablet 11  . FLUoxetine (PROZAC) 40 MG capsule Take 1 capsule (40 mg total) by mouth daily. 90 capsule 3  . gabapentin (NEURONTIN) 300 MG capsule Take 3 capsules (900 mg total) by mouth at bedtime. 180 capsule 3  . guanFACINE (INTUNIV) 1 MG TB24 ER tablet TAKE 1 TABLET BY MOUTH EVERY MORNING AND 3 TABLETS EVERY NIGHT AT BEDTIME 360 tablet 3  . hydrocortisone 2.5 % ointment Apply topically 2 (two) times daily as needed. For mild urticaria. Do not use for more than 1-2 weeks at a time. 20 g 1  . Melatonin 5 MG TABS Take 2 tablets (10 mg total) by mouth at bedtime. 60 tablet 3  . polyethylene glycol powder (GLYCOLAX/MIRALAX) powder Take 17 g by mouth once. 500 g 4   No facility-administered medications prior to visit.     Patient Active Problem List   Diagnosis Date Noted  . Weight loss 06/28/2019  . Attention deficit hyperactivity disorder (ADHD), combined type 07/24/2018  . Sleep disturbance 07/24/2018  . Repetitive movement 02/16/2018  . Developmental delay 02/16/2018  . Aggressive behavior of adolescent 07/14/2017  . Caregiver burden 03/10/2017  . Intellectual disability 01/21/2017  . Meningitis 01/21/2017  . Seizure ([place]) 01/21/2017  . Need for home health  care 12/23/2016  . 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 Abnormality of gait 10/18/2014  . Postconcussion syndrome 10/18/2014  . History of psychosocial problem 05/08/2014  . Aggression 08/19/2013  . Constipation 08/19/2013  . Enuresis 08/19/2013  . 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 Scoliosis (and kyphoscoliosis), idiopathic 09/19/2012  . Acne 09/19/2012    Confidentiality was discussed with the caregiver   The following portions of the patient's history were reviewed and updated as appropriate: allergies, current medications, past family history, past medical history, past social history, past surgical history and problem list.  Physical Exam:  Vitals:   There were no vitals taken for this visit. Body mass index: body mass index is unknown because there is no height or weight on file. Blood pressure percentiles are not available for patients who are 18 years or older.  Physical Exam Constitutional:      General: She is not in acute distress.    Comments: Alert and active, overall well-appearing, developmentally delayed and non-verbal at baseline; tearing pieces of magazine paper but calm and happy  HENT:     Nose: Nose normal, nares without discharge  Pulmonary:     Effort: Pulmonary effort is normal, no increased WOB Neurological:     Mental Status: She is alert.     Comments: Developmentally delayed, understands commands/questions and able to gesture for needs    Assessment/Plan: 1. Caregiver burden Mother still having significant difficulty caring for Karsen and is seeking additional assistance with obtaining an Risk analyst. In order to obtain the waiver, Sharlize is still awaiting further evaluation to document baseline functionality and identify a unifying diagnosis. Mother additionally requesting additional paperwork. - Will refer to [facility] complex care for further evaluation  - Plan to identify and reach out to local contact regarding further requirements needed to obtain [employer] Waiver  - Resources for Medicaid transportation assistance provided today  2. Disruptive behavior disorder Behavior unchanged and stable since last visit. - Continue home medications   Follow-up:  Depends  on referral to [facility]  Medical decision-making:  > 30 minutes spent, more than 50% of appointment was spent discussing diagnosis and management of symptoms  Phillips Odor, MD [facility] PGY1

## 2019-10-02 NOTE — Telephone Encounter (Signed)
The plan was to go over the new patient paperwork today in clinic with mom, while here for a follow up for [name]. Today was not a good day for [employer]y [name]. Paperwork provided to mom for evaluation with [facility], per mom's request. She will look it over and give me a call to set up a time to talk over the phone and/or face to face. Per mom, she may be able to set up a time to come in without Misty [name] to complete the paperwork. Explained to mom that either option is fine, and if she has to bring Misty [name] in for appointments that is okay. Mom to call me next week so we can decide times that work for her. All appointment days/times provided for evaluation, which mom wrote down.

## 2019-10-08 NOTE — Telephone Encounter (Signed)
With the delays this patient has I'm assuming she's had EC services at school and if so there are evaluations in her school file. Looking at one of Dr. Lamar Sprinkles notes from previous appointment looks like she used to attend [name] with [employer]. Have records ever been requested from there?

## 2019-10-08 NOTE — Telephone Encounter (Signed)
Please follow our clinic policy with follow-up and closing referrals if there is no response. Thank you.

## 2019-10-08 NOTE — Telephone Encounter (Signed)
Cyndie Chime saw this family last week, so she and I have been in communication with mom. There is additional documentation in the chart from my conversation with mom. Per mom, she doesn't have any knowledge of evaluations completed before.

## 2019-10-08 NOTE — Telephone Encounter (Signed)
Yes there was a consent included in the NPP that I gave her at last week's appointment. Mom wanted to review everything before signing and she said she is going to call me once her phone is fixed, which should be this week, so I can have a one on one with mom to complete everything. The NPP wasn't completed last week in clinic because Cristian wasn't having a good day and we were trying to get family in and out as quickly as possible. Mom did write down all of the appointment dates and confirmed they all work for her.  I actually discussed with Neysa Bonito as well and Emerald may not come to you for evaluation, because Dr. Marina Goodell wants her evaluation to be done sooner. She is working with someone at [facility] to get her in sooner. I'll let you know what is decided on that.

## 2019-10-09 ENCOUNTER — Other Ambulatory Visit: Payer: Self-pay | Admitting: Family

## 2019-10-09 NOTE — Telephone Encounter (Signed)
That's fine. Please let me know if she is coming to me and status of record request from school.

## 2019-10-09 NOTE — Telephone Encounter (Signed)
Pt is being seen next week by [facility]. Neysa Bonito is working with Dr. Marina Goodell. Mom is going to come into clinic for [name] to help assist with video visit.

## 2019-10-11 ENCOUNTER — Telehealth: Payer: Self-pay | Admitting: Pediatrics

## 2019-10-11 NOTE — Telephone Encounter (Signed)
I have been playing phone tag with mom. I just left her another message. Tiffny is scheduled to see Dr. Rogers Blocker with [facility] next Thursday at 11am. We are going to bring mom and Kerby into our clinic at that time to help coordinate the virtual visit. I included this information on the message and scheduled an appointment for [place] for 10:30am here in our office.  I am going to confirm mom's primary language to see if she would be open to using an interpreter. Mom speaks English, but there is still some barrier when it comes to medical terminology. Mom has refused interpretation services in the past. Jena has had many referrals in the past with little to no follow through and the goal is to get her the services she needs and find out what the barriers are preventing this.   Spoke with Bernell List, FNP regarding [place] [employer] Waiver per mom's request. The number for [employer] is [phone]. I called and spoke with [facility]. I will need to complete registry form online. Link is below.  [email or web address]&TZOffset=240&TZStdOffset=-300&HasDST=true  Once completed, mom will get a phone call and/or a letter in the mail with instructions. There will be a phone call appointment set up to determine eligibility. Per French Ana, I can have a consent form signed to give me permission to communicate with [employer] in order to help mom.   I need to confirm if mom has any sort of guardianship over Lucy. Per French Ana, since she is an adult, a consent will need to be signed by Chemika allowing for mom to make decisions. If Rhythm is able to provide any sort of signature, a consent can be signed by Zlata, to give permission for her mom to communicate on her behalf. I also need to do a chart review to see if Lucee was ever in school with an IEP, or had an  evaluation of any kind. A psychological will be needed to complete the Innovation application, as a diagnosis is required. Explained to French Ana that we are in the process of getting that taken care of now. Per French Ana, I can go ahead and submit her to the registry via link above, and submit the psychological once we have it.

## 2019-10-17 NOTE — Telephone Encounter (Signed)
Spoke with mom and confirmed that she and [employer] [name] are coming to our clinic tomorrow morning at 10:30am. She will be placed in a room in red pod and I will ensure mom connects with Dr. Rogers Blocker at [name] virtually at [facility] with staff at [facility] and confirmed the visit. Mom will receive a link to her phone to complete the visit.   Discussed the possibility of an interpreter with mom, since medical terminology can be difficult to understand. Mom declined and said she is fine and would not like an interpreter present. Per mom, she is comfortable with English as long as the person speaking doesn't talk too fast.

## 2019-10-18 ENCOUNTER — Encounter: Payer: Self-pay | Admitting: Pediatrics

## 2019-10-18 ENCOUNTER — Ambulatory Visit: Payer: Medicaid Other | Admitting: Clinical

## 2019-10-18 NOTE — Progress Notes (Signed)
   Current Outpatient Medications on File Prior to Visit  Medication Sig Dispense Refill  . ARIPiprazole (ABILIFY) 10 MG tablet Take 1 tablet (10 mg total) by mouth at bedtime. 90 tablet 3  . ARIPiprazole (ABILIFY) 2 MG tablet Take 1 tablet (2 mg total) by mouth 2 (two) times daily as needed (for aggression). 30 tablet 3  . cyproheptadine (PERIACTIN) 4 MG tablet Take 1 tablet (4 mg total) by mouth 3 (three) times daily. 90 tablet 3  . Ensure (ENSURE) Take 1 Can by mouth 2 (two) times daily between meals. 14220 mL 3  . Ferrous Sulfate (IRON) 325 (65 Fe) MG TABS Take 1 tablet (325 mg total) by mouth daily. 30 tablet 11  . FLUoxetine (PROZAC) 40 MG capsule Take 1 capsule (40 mg total) by mouth daily. 90 capsule 3  . gabapentin (NEURONTIN) 300 MG capsule Take 3 capsules (900 mg total) by mouth at bedtime. 180 capsule 3  . guanFACINE (INTUNIV) 1 MG TB24 ER tablet TAKE 1 TABLET BY MOUTH EVERY MORNING AND 3 TABLETS EVERY NIGHT AT BEDTIME 360 tablet 3  . hydrocortisone 2.5 % ointment Apply topically 2 (two) times daily as needed. For mild urticaria. Do not use for more than 1-2 weeks at a time. 20 g 1  . Melatonin 5 MG TABS Take 2 tablets (10 mg total) by mouth at bedtime. 60 tablet 3  . polyethylene glycol powder (GLYCOLAX/MIRALAX) powder Take 17 g by mouth once. 500 g 4   No current facility-administered medications on file prior to visit.

## 2019-10-19 NOTE — Telephone Encounter (Signed)
LVM for mom letting her know that Dr. Rogers Blocker at [facility] has not completed her note, as mom inquired about this yesterday in clinic. Alfonso Ramus, FNP has been in contact with Dr. Rogers Blocker and also provided her my email address. Will reach out to mom once I have an update on next steps, etc. Left all of this in a detailed voicemail and left my direct line for mom to call back if she has questions.

## 2019-10-25 NOTE — Telephone Encounter (Signed)
Message sent to Dr. Rogers Blocker this week via care everywhere, providing my email address and phone number in case she did not receive it. Spoke with mom today, as she had call and left me several messages over the last couple of days. Mom asked for a copy of Dr. Rogers Blocker' note. Explained to mom that since Dr. Rogers Blocker is from another office, I am not authorized to release copies of her notes. [facility]. Mom will call and request a copy of the note. Mom asked if a follow up has been scheduled with Dr. Rogers Blocker. Explained to mom that Dr. Rogers Blocker told Alfonso Ramus, FNP that she will be reaching out to be via phone or email with the next steps. Once a follow up is scheduled and I hear from Dr. Rogers Blocker, I will reach out to mom. Mom expressed understanding.

## 2019-10-28 ENCOUNTER — Emergency Department (HOSPITAL_COMMUNITY): Payer: Medicaid Other

## 2019-10-28 ENCOUNTER — Other Ambulatory Visit: Payer: Self-pay

## 2019-10-28 ENCOUNTER — Emergency Department (HOSPITAL_COMMUNITY)
Admission: EM | Admit: 2019-10-28 | Discharge: 2019-10-28 | Disposition: A | Discharge status: Home / Self Care | Payer: Medicaid Other | Attending: Emergency Medicine | Admitting: Emergency Medicine

## 2019-10-28 ENCOUNTER — Encounter (HOSPITAL_COMMUNITY): Payer: Self-pay | Admitting: *Deleted

## 2019-10-28 DIAGNOSIS — F909 Attention-deficit hyperactivity disorder, unspecified type: Secondary | ICD-10-CM | POA: Diagnosis not present

## 2019-10-28 DIAGNOSIS — R454 Irritability and anger: Secondary | ICD-10-CM | POA: Insufficient documentation

## 2019-10-28 DIAGNOSIS — R222 Localized swelling, mass and lump, trunk: Secondary | ICD-10-CM | POA: Diagnosis present

## 2019-10-28 DIAGNOSIS — Z79899 Other long term (current) drug therapy: Secondary | ICD-10-CM | POA: Insufficient documentation

## 2019-10-28 DIAGNOSIS — K5900 Constipation, unspecified: Secondary | ICD-10-CM | POA: Diagnosis not present

## 2019-10-28 LAB — LIPASE, BLOOD: Lipase: 22 U/L (ref 11–51)

## 2019-10-28 LAB — CBC WITH DIFFERENTIAL/PLATELET
Abs Immature Granulocytes: 0.02 10*3/uL (ref 0.00–0.07)
Basophils Absolute: 0 10*3/uL (ref 0.0–0.1)
Basophils Relative: 1 %
Eosinophils Absolute: 0.1 10*3/uL (ref 0.0–0.5)
Eosinophils Relative: 1 %
HCT: 37.7 % (ref 36.0–46.0)
Hemoglobin: 12.1 g/dL (ref 12.0–15.0)
Immature Granulocytes: 0 %
Lymphocytes Relative: 30 %
Lymphs Abs: 1.7 10*3/uL (ref 0.7–4.0)
MCH: 29.4 pg (ref 26.0–34.0)
MCHC: 32.1 g/dL (ref 30.0–36.0)
MCV: 91.7 fL (ref 80.0–100.0)
Monocytes Absolute: 0.5 10*3/uL (ref 0.1–1.0)
Monocytes Relative: 9 %
Neutro Abs: 3.4 10*3/uL (ref 1.7–7.7)
Neutrophils Relative %: 59 %
Platelets: 244 10*3/uL (ref 150–400)
RBC: 4.11 MIL/uL (ref 3.87–5.11)
RDW: 13.2 % (ref 11.5–15.5)
WBC: 5.7 10*3/uL (ref 4.0–10.5)
nRBC: 0 % (ref 0.0–0.2)

## 2019-10-28 LAB — I-STAT BETA HCG BLOOD, ED (MC, WL, AP ONLY): I-stat hCG, quantitative: <5 m[IU]/mL (ref <5)

## 2019-10-28 LAB — URINALYSIS, ROUTINE W REFLEX MICROSCOPIC
Bilirubin Urine: NEGATIVE
Glucose, UA: NEGATIVE mg/dL
Hgb urine dipstick: NEGATIVE
Ketones, ur: NEGATIVE mg/dL
Leukocytes,Ua: NEGATIVE
Nitrite: NEGATIVE
Protein, ur: NEGATIVE mg/dL
Specific Gravity, Urine: 1.008 (ref 1.005–1.030)
pH: 8 (ref 5.0–8.0)

## 2019-10-28 LAB — COMPREHENSIVE METABOLIC PANEL
ALT: 14 U/L (ref 0–44)
AST: 13 U/L — ABNORMAL LOW (ref 15–41)
Albumin: 4.1 g/dL (ref 3.5–5.0)
Alkaline Phosphatase: 53 U/L (ref 38–126)
Anion gap: 9 (ref 5–15)
BUN: 7 mg/dL (ref 6–20)
CO2: 23 mmol/L (ref 22–32)
Calcium: 9.3 mg/dL (ref 8.9–10.3)
Chloride: 105 mmol/L (ref 98–111)
Creatinine, Ser: 0.5 mg/dL (ref 0.44–1.00)
GFR calc Af Amer: >60 mL/min (ref >60)
GFR calc non Af Amer: >60 mL/min (ref >60)
Glucose, Bld: 95 mg/dL (ref 70–99)
Potassium: 3.5 mmol/L (ref 3.5–5.1)
Sodium: 137 mmol/L (ref 135–145)
Total Bilirubin: 0.8 mg/dL (ref 0.3–1.2)
Total Protein: 6.9 g/dL (ref 6.5–8.1)

## 2019-10-28 MED ORDER — LORAZEPAM 2 MG/ML IJ SOLN
2.0000 mg | Freq: Once | INTRAMUSCULAR | Status: AC
  Administered 2019-10-28: 2 mg via INTRAMUSCULAR
  Filled 2019-10-28: qty 1

## 2019-10-28 MED ORDER — LORAZEPAM 2 MG/ML IJ SOLN: 2.0000 mg | Freq: Once | INTRAMUSCULAR | Status: DC

## 2019-10-28 NOTE — ED Triage Notes (Signed)
Pt went to the br  As soon as she checked I  Still there

## 2019-10-28 NOTE — ED Notes (Signed)
Pt has returned to room from BR.  Per mother pt had to assist with stool removal from rectum for patient x 3 times.  PA in to speak with mother and answered mothers questions.

## 2019-10-28 NOTE — ED Triage Notes (Signed)
The pt has autism and adhd  Her mother  Reports that the pt cannot hear or speak.  She has not slept in 2 days and  She has been crying with the lt rib or chest pain just beneath her lt breast  lmp  3 weeks ago

## 2019-10-28 NOTE — ED Provider Notes (Signed)
[facility] EMERGENCY DEPARTMENT Provider Note   CSN: [id number] Arrival date & time: 10/28/19  4818   History Chief Complaint  Patient presents with  . Chest Pain   Misty [name] is a 19 y.o. female with history of severe autism, kyphoscoliosis, ADHD, seizure disorder, aggressive behavior and mental retardation who presents with swelling under the L breast area and crying. He mom is at bedside. She states that she has been more irritable and uncontrollable crying for the past 2 weeks. She seems to be pointing to her chest and upper abdomen but she is non-verbal. Her mom noticed swelling under her L breast that seems to come and go. She had a fever a couple days ago. She has been eating and drinking and going to the bathroom normally. Her mom is concerned about whole body shaking episodes which she hasn't had before. She has been alert and conscious during these episodes. Mom states the patient is normally very happy and doesn't cry a lot.  LEVEL 5 caveat due to pt being non-verbal  HPI     Past Medical History:  Diagnosis Date  . Behavior concern   . 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 Sleep disorder     Patient Active Problem List   Diagnosis Date Noted  . Attention deficit hyperactivity disorder (ADHD), combined type 07/24/2018  . Repetitive movement 02/16/2018  . Aggressive behavior of adolescent 07/14/2017  . Caregiver burden 03/10/2017  . Intellectual disability 01/21/2017  . Seizure (HCC) 01/21/2017  . Need for home health care 12/23/2016  . 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 Abnormality of gait 10/18/2014  . History of psychosocial problem 05/08/2014  . Constipation 08/19/2013  . Enuresis 08/19/2013  . 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 Disturbance in  sleep behavior 09/19/2012  . Scoliosis (and kyphoscoliosis), idiopathic 09/19/2012  . Acne 09/19/2012    History reviewed. No pertinent surgical history.   OB History   No obstetric history on file.     Family History  Problem Relation Age of Onset  . Anxiety disorder Mother   . Fibromyalgia Mother   . Lumbar disc disease Mother   . Hypothyroidism Sister     Social History   Tobacco Use  . Smoking status: Never Smoker  . Smokeless tobacco: Never Used  Substance Use Topics  . Alcohol use: Never  . Drug use: Not on file    Home Medications Prior to Admission medications   Medication Sig Start Date End Date Taking? Authorizing Provider  ARIPiprazole (ABILIFY) 10 MG tablet Take 1 tablet (10 mg total) by mouth at bedtime. 08/22/19   Lorenz Coaster, MD  ARIPiprazole (ABILIFY) 2 MG tablet Take 1 tablet (2 mg total) by mouth 2 (two) times daily as needed (for aggression). 08/22/19   Lorenz Coaster, MD  cyproheptadine (PERIACTIN) 4 MG tablet Take 1 tablet (4 mg total) by mouth 3 (three) times daily. 08/22/19   Lorenz Coaster, MD  Ensure (ENSURE) Take 1 Can by mouth 2 (two) times daily between meals. 08/23/19   Lorenz Coaster, MD  Ferrous Sulfate (IRON) 325 (65 Fe) MG TABS Take 1 tablet (325 mg total) by mouth daily. 08/22/19   Lorenz Coaster, MD  FLUoxetine (PROZAC) 40 MG capsule Take 1 capsule (40 mg total) by mouth daily. 08/22/19   Lorenz Coaster, MD  gabapentin (  NEURONTIN) 300 MG capsule Take 3 capsules (900 mg total) by mouth at bedtime. 08/22/19   Lorenz Coaster, MD  guanFACINE (INTUNIV) 1 MG TB24 ER tablet TAKE 1 TABLET BY MOUTH EVERY MORNING AND 3 TABLETS EVERY NIGHT AT BEDTIME 08/22/19   Lorenz Coaster, MD  hydrocortisone 2.5 % ointment Apply topically 2 (two) times daily as needed. For mild urticaria. Do not use for more than 1-2 weeks at a time. 07/14/17   Clint Guy, MD  Melatonin 5 MG TABS Take 2 tablets (10 mg total) by mouth at bedtime. 06/29/19   Lorenz Coaster, MD  polyethylene glycol powder (GLYCOLAX/MIRALAX) powder Take 17 g by mouth once. 05/22/15   [name], MD    Allergies    Eggs or egg-derived products  Review of Systems   Review of Systems  Unable to perform ROS: Patient nonverbal    Physical Exam Updated Vital Signs BP 109/73   Pulse 83   Resp 17   Ht 5\' 6"  (1.676 m)   Wt 51.7 kg   LMP 10/07/2019   SpO2 100%   BMI 18.40 kg/m   Physical Exam Vitals and nursing note reviewed.  Constitutional:      General: She is not in acute distress.    Appearance: She is well-developed. She is not ill-appearing.     Comments: Alert, smiling. Non-verbal. Agitated at time with monitor leads and IV  HENT:     Head: Normocephalic and atraumatic.     Ears:     Comments: Impacted cerumen bilaterally    Nose: Nose normal.     Mouth/Throat:     Mouth: Mucous membranes are moist.     Pharynx: No oropharyngeal exudate or posterior oropharyngeal erythema.  Eyes:     General: No scleral icterus.       Right eye: No discharge.        Left eye: No discharge.     Conjunctiva/sclera: Conjunctivae normal.     Pupils: Pupils are equal, round, and reactive to light.  Cardiovascular:     Rate and Rhythm: Normal rate and regular rhythm.  Pulmonary:     Effort: Pulmonary effort is normal. No respiratory distress.     Breath sounds: Normal breath sounds.  Abdominal:     General: There is no distension.     Palpations: Abdomen is soft.     Tenderness: There is no abdominal tenderness.     Hernia: A hernia is present.     Comments: Small ventral hernia when patient lifts her head off the stretcher which reduces when she lies flat  Musculoskeletal:     Cervical back: Normal range of motion.  Skin:    General: Skin is warm and dry.  Neurological:     Mental Status: She is alert. Mental status is at baseline.  Psychiatric:        Behavior: Behavior normal.     ED Results / Procedures / Treatments   Labs (all labs ordered  are listed, but only abnormal results are displayed) Labs Reviewed  COMPREHENSIVE METABOLIC PANEL - Abnormal; Notable for the following components:      Result Value   AST 13 (*)    All other components within normal limits  URINALYSIS, ROUTINE W REFLEX MICROSCOPIC - Abnormal; Notable for the following components:   Color, Urine STRAW (*)    All other components within normal limits  CBC WITH DIFFERENTIAL/PLATELET  LIPASE, BLOOD  I-STAT BETA HCG BLOOD, ED ([facility], [facility], [facility]  ONLY)    EKG None  Radiology DG Chest Port 1 View  Result Date: 10/28/2019 CLINICAL DATA:  Chest pain EXAM: PORTABLE CHEST 1 VIEW COMPARISON:  Portable exam 0905 hours without priors for comparison FINDINGS: Normal heart size, mediastinal contours, and pulmonary vascularity. Lungs clear. No pleural effusion or pneumothorax. Bones unremarkable. IMPRESSION: No acute abnormalities. Electronically Signed   By: Ulyses Southward M.D.   On: 10/28/2019 09:17   CT Renal Stone Study  Result Date: 10/28/2019 CLINICAL DATA:  Left lower rib or chest pain just beneath the breast for the past 2 days. No reported injury. Clinical concern for a hernia. EXAM: CT ABDOMEN AND PELVIS WITHOUT CONTRAST TECHNIQUE: Multidetector CT imaging of the abdomen and pelvis was performed following the standard protocol without IV contrast. COMPARISON:  None. FINDINGS: Lower chest: Mild distortion of the chest due to levoconvex scoliosis. Clear lung bases. Normal sized heart. No chest wall or breast abnormalities visualized. Hepatobiliary: No focal liver abnormality is seen. No gallstones, gallbladder wall thickening, or biliary dilatation. Pancreas: Unremarkable. No pancreatic ductal dilatation or surrounding inflammatory changes. Spleen: Normal in size without focal abnormality. Adrenals/Urinary Tract: Adrenal glands are unremarkable. Kidneys are normal, without renal calculi, focal lesion, or hydronephrosis. Bladder is unremarkable. Stomach/Bowel: Unremarkable  stomach, small bowel and colon. No evidence of appendicitis. Mildly prominent stool in the colon, most pronounced in the rectum. Vascular/Lymphatic: No significant vascular findings are present. No enlarged abdominal or pelvic lymph nodes. Reproductive: Uterus and bilateral adnexa are unremarkable. Other: No abdominal wall hernia or abnormality. No abdominopelvic ascites. Musculoskeletal: Mild to moderate levoconvex thoracolumbar scoliosis. Otherwise, normal appearing bones. No rib abnormalities are visualized. IMPRESSION: 1. No acute abnormality. 2. Mild to moderate levoconvex thoracolumbar scoliosis. 3. Mildly prominent stool in the colon, most pronounced in the rectum. Electronically Signed   By: Beckie Salts M.D.   On: 10/28/2019 11:14    Procedures Procedures (including critical care time)  Medications Ordered in ED Medications  LORazepam (ATIVAN) injection 2 mg (has no administration in time range)  LORazepam (ATIVAN) injection 2 mg (2 mg Intramuscular Given 10/28/19 1025)    ED Course  I have reviewed the triage vital signs and the nursing notes.  Pertinent labs & imaging results that were available during my care of the patient were reviewed by me and considered in my medical decision making (see chart for details).  19 year old female who is non-verbal presents with increased crying spells and shaking per her mother who is primary care giver as well as possible chest vs abdominal pain. EKG is NSR. Her vitals signs are reassuring. She is alert and acting at her baseline on my exam. HENT exam is unremarkable. Heart is regular rate and rhythm. Lungs are CTA. Abdomen is soft and non-tender. She has a slight vental hernia when she raises her head off the bed which is what her mother is concerned about. I highly doubt this is causing her pain but since she is non-verbal will obtain labs, UA, CXR, and CT abdomen/pelvis. Mom is asking for various testing such as Vitamin D and MRI. Explained that pt  will likely not be able to tolerate MRI and would not be indicated today. She verbalized understanding  Labs are overall reassuring. UA does not show any sign of infection. CXR is negative. CT renal was obtained since pt was not able to tolerate an IV. It is negative for any acute abnormality but does show increased stool burden. Pt actually had a large BM while in the  ED. Mom is still concerned about patient shaking and the swelling and she had to be reassuring multiple times. Encouraged f/u with patient's primary care provider.  MDM Rules/Calculators/A&P                           Final Clinical Impression(s) / ED Diagnoses Final diagnoses:  Irritability  Constipation, unspecified constipation type    Rx / DC Orders ED Discharge Orders    None       Recardo Evangelist, PA-C 10/28/19 1547    Blanchie Dessert, MD 10/28/19 1550

## 2019-10-28 NOTE — ED Notes (Signed)
Pts mother requesting to talk with provider regarding discharge instructions.  Tresa Endo, PA will go in and talk with pt as soon as pt returns from [facility].

## 2019-10-28 NOTE — Discharge Instructions (Signed)
Please increase the Miralax so Misty [name] can have a good bowel movement Have her drink plenty of fluids and eat foods with fiber Please follow up with your doctor

## 2019-10-29 ENCOUNTER — Other Ambulatory Visit (INDEPENDENT_AMBULATORY_CARE_PROVIDER_SITE_OTHER): Payer: Self-pay | Admitting: Pediatrics

## 2019-10-29 DIAGNOSIS — R4689 Other symptoms and signs involving appearance and behavior: Secondary | ICD-10-CM

## 2019-10-29 DIAGNOSIS — G9349 Other encephalopathy: Secondary | ICD-10-CM

## 2019-10-29 DIAGNOSIS — F919 Conduct disorder, unspecified: Secondary | ICD-10-CM

## 2019-10-29 NOTE — Progress Notes (Signed)
Previous order 06/2018 for [facility] expired.  New order placed.   Lorenz Coaster MD MPH

## 2019-11-01 ENCOUNTER — Ambulatory Visit (INDEPENDENT_AMBULATORY_CARE_PROVIDER_SITE_OTHER): Payer: Medicaid Other | Admitting: Pediatrics

## 2019-11-01 ENCOUNTER — Institutional Professional Consult (permissible substitution) (INDEPENDENT_AMBULATORY_CARE_PROVIDER_SITE_OTHER): Payer: Medicaid Other | Admitting: Psychology

## 2019-11-08 ENCOUNTER — Ambulatory Visit (INDEPENDENT_AMBULATORY_CARE_PROVIDER_SITE_OTHER): Payer: Medicaid Other | Admitting: Pediatrics

## 2019-11-13 ENCOUNTER — Ambulatory Visit: Payer: Medicaid Other | Admitting: Family

## 2019-11-13 ENCOUNTER — Other Ambulatory Visit: Payer: Self-pay

## 2019-11-13 DIAGNOSIS — F79 Unspecified intellectual disabilities: Secondary | ICD-10-CM

## 2019-11-13 NOTE — Progress Notes (Signed)
Rescheduled for next week in person with Dr Marina Goodell 2:30  Confirmed upcoming appointment with Dr Rogers Blocker at [facility]

## 2019-11-15 ENCOUNTER — Ambulatory Visit (INDEPENDENT_AMBULATORY_CARE_PROVIDER_SITE_OTHER): Payer: Medicaid Other | Admitting: Psychology

## 2019-11-15 ENCOUNTER — Ambulatory Visit (INDEPENDENT_AMBULATORY_CARE_PROVIDER_SITE_OTHER): Payer: Medicaid Other | Admitting: Pediatrics

## 2019-11-15 ENCOUNTER — Other Ambulatory Visit: Payer: Self-pay

## 2019-11-15 ENCOUNTER — Institutional Professional Consult (permissible substitution) (INDEPENDENT_AMBULATORY_CARE_PROVIDER_SITE_OTHER): Payer: Medicaid Other | Admitting: Psychology

## 2019-11-15 ENCOUNTER — Ambulatory Visit (INDEPENDENT_AMBULATORY_CARE_PROVIDER_SITE_OTHER): Payer: Medicaid Other

## 2019-11-15 ENCOUNTER — Encounter (INDEPENDENT_AMBULATORY_CARE_PROVIDER_SITE_OTHER): Payer: Self-pay | Admitting: Pediatrics

## 2019-11-15 VITALS — BP 118/78 | HR 104 | Ht 63.5 in | Wt 110.0 lb

## 2019-11-15 DIAGNOSIS — F902 Attention-deficit hyperactivity disorder, combined type: Secondary | ICD-10-CM

## 2019-11-15 DIAGNOSIS — F79 Unspecified intellectual disabilities: Secondary | ICD-10-CM

## 2019-11-15 DIAGNOSIS — R4689 Other symptoms and signs involving appearance and behavior: Secondary | ICD-10-CM

## 2019-11-15 DIAGNOSIS — F919 Conduct disorder, unspecified: Secondary | ICD-10-CM

## 2019-11-15 DIAGNOSIS — R6250 Unspecified lack of expected normal physiological development in childhood: Secondary | ICD-10-CM

## 2019-11-15 DIAGNOSIS — Z638 Other specified problems related to primary support group: Secondary | ICD-10-CM

## 2019-11-15 DIAGNOSIS — Q043 Other reduction deformities of brain: Secondary | ICD-10-CM

## 2019-11-15 DIAGNOSIS — R32 Unspecified urinary incontinence: Secondary | ICD-10-CM

## 2019-11-15 DIAGNOSIS — Z636 Dependent relative needing care at home: Secondary | ICD-10-CM | POA: Diagnosis not present

## 2019-11-15 DIAGNOSIS — G9349 Other encephalopathy: Secondary | ICD-10-CM

## 2019-11-15 DIAGNOSIS — R634 Abnormal weight loss: Secondary | ICD-10-CM

## 2019-11-15 DIAGNOSIS — G479 Sleep disorder, unspecified: Secondary | ICD-10-CM

## 2019-11-15 DIAGNOSIS — Z09 Encounter for follow-up examination after completed treatment for conditions other than malignant neoplasm: Secondary | ICD-10-CM
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
## 2019-11-15 NOTE — Patient Instructions (Addendum)
   Great job gaining [employer] and great work on behavior!  Dr Huntley Dec will see you today for counseling on parenting training, working on evaluation services, and discussing caregiver burden.    Call Dr [name] for a primary care visit  Please call your primary care doctor for information on disibility  New referral sent for physical therapy and new order sent for hospital bed  Refills on all medications for sleep, behavior, and appetite
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
## 2019-11-19 ENCOUNTER — Encounter (INDEPENDENT_AMBULATORY_CARE_PROVIDER_SITE_OTHER): Payer: Self-pay

## 2019-11-19 NOTE — Progress Notes (Signed)
faxed DME and PT orders to [facility] copy of orders mailed to mom

## 2019-11-20 ENCOUNTER — Ambulatory Visit (INDEPENDENT_AMBULATORY_CARE_PROVIDER_SITE_OTHER): Payer: Medicaid Other | Admitting: Pediatrics

## 2019-11-20 DIAGNOSIS — F79 Unspecified intellectual disabilities: Secondary | ICD-10-CM | POA: Diagnosis not present

## 2019-11-20 DIAGNOSIS — R4689 Other symptoms and signs involving appearance and behavior: Secondary | ICD-10-CM | POA: Diagnosis not present

## 2019-11-20 DIAGNOSIS — Z636 Dependent relative needing care at home: Secondary | ICD-10-CM | POA: Diagnosis not present
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
## 2019-11-26 ENCOUNTER — Ambulatory Visit: Payer: Medicaid Other | Admitting: Pediatrics

## 2019-12-04 ENCOUNTER — Ambulatory Visit (INDEPENDENT_AMBULATORY_CARE_PROVIDER_SITE_OTHER): Payer: Medicaid Other | Admitting: Pediatrics

## 2019-12-04 ENCOUNTER — Other Ambulatory Visit: Payer: Self-pay

## 2019-12-04 DIAGNOSIS — Z636 Dependent relative needing care at home: Secondary | ICD-10-CM

## 2019-12-04 DIAGNOSIS — F79 Unspecified intellectual disabilities: Secondary | ICD-10-CM

## 2019-12-04 DIAGNOSIS — R4689 Other symptoms and signs involving appearance and behavior: Secondary | ICD-10-CM

## 2019-12-04 NOTE — Patient Instructions (Addendum)
Bring all of Misty [name]'s medications to the next visit.   We will work on getting old records of testing.

## 2019-12-04 NOTE — Progress Notes (Signed)
THIS RECORD MAY CONTAIN CONFIDENTIAL INFORMATION THAT SHOULD NOT BE RELEASED WITHOUT REVIEW OF THE SERVICE PROVIDER.  Adolescent Medicine Consultation Follow-Up Visit Misty [name] Misty [name]  is a 19 y.o. female referred by [name], MD here today for follow-up of caregiver assistance in the setting of Misty [name]'s aggressive behavior and intellectual disability.   Previsit planning completed:  yes  Growth Chart Viewed? yes   History was provided by the mother.  PCP Confirmed?  yes  My Chart Activated?   yes   HPI:    Mother reports the patient continues to have the same challenges and mother would like additional assistance with this. She was denied emergency approval for the waiver. Mother is wondering what is next. She has paperwork with her that explains next steps.   No LMP recorded. Allergies  Allergen Reactions  . Eggs Or Egg-Derived Products Other (See Comments)    Other reaction(s): HIVES   Outpatient Medications Prior to Visit  Medication Sig Dispense Refill  . ARIPiprazole (ABILIFY) 10 MG tablet Take 1 tablet (10 mg total) by mouth at bedtime. 90 tablet 3  . ARIPiprazole (ABILIFY) 2 MG tablet Take 1 tablet (2 mg total) by mouth 2 (two) times daily as needed (for aggression). 30 tablet 3  . cyproheptadine (PERIACTIN) 4 MG tablet Take 1 tablet (4 mg total) by mouth 3 (three) times daily. 90 tablet 3  . Ensure (ENSURE) Take 1 Can by mouth 2 (two) times daily between meals. 14220 mL 3  . Ferrous Sulfate (IRON) 325 (65 Fe) MG TABS Take 1 tablet (325 mg total) by mouth daily. 30 tablet 11  . FLUoxetine (PROZAC) 40 MG capsule Take 1 capsule (40 mg total) by mouth daily. 90 capsule 3  . gabapentin (NEURONTIN) 300 MG capsule Take 3 capsules (900 mg total) by mouth at bedtime. 180 capsule 3  . guanFACINE (INTUNIV) 1 MG TB24 ER tablet TAKE 1 TABLET BY MOUTH EVERY MORNING AND 3 TABLETS EVERY NIGHT AT BEDTIME 360 tablet 3  . hydrocortisone 2.5 % ointment Apply topically 2 (two)  times daily as needed. For mild urticaria. Do not use for more than 1-2 weeks at a time. 20 g 1  . Melatonin 5 MG TABS Take 2 tablets (10 mg total) by mouth at bedtime. 60 tablet 3  . polyethylene glycol powder (GLYCOLAX/MIRALAX) powder Take 17 g by mouth once. 500 g 4   No facility-administered medications prior to visit.     Patient Active Problem List   Diagnosis Date Noted  . Attention deficit hyperactivity disorder (ADHD), combined type 07/24/2018  . Repetitive movement 02/16/2018  . Aggressive behavior of adolescent 07/14/2017  . Caregiver burden 03/10/2017  . Intellectual disability 01/21/2017  . Seizure (HCC) 01/21/2017  . Need for home health care 12/23/2016  . 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 Abnormality of gait 10/18/2014  . History of psychosocial problem 05/08/2014  . Constipation 08/19/2013  . Enuresis 08/19/2013  . 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 Scoliosis (and kyphoscoliosis), idiopathic 09/19/2012  . Acne 09/19/2012   Physical Exam:   Physical Exam Constitutional:      General: She is not in acute distress.    Comments: Alert and active, overall well-appearing, developmentally delayed and with some minimal verbalization; tearing pieces of magazine paper but calm and happy  HENT:     Nose: Nose normal, nares without discharge  Pulmonary:     Effort: Pulmonary effort is normal, no increased WOB Neurological:     Mental Status: She is alert.     Comments: Developmentally delayed, understands commands/questions and able to gesture for needs    Assessment/Plan: 1. Caregiver burden Reviewed paperwork and next steps for the patient. Discussed that support from our case management team would be most beneficial. Encouraged to use an  interpreter and mother declined this.   2. Disruptive behavior disorder Reviewed medications with mother but I am concerned about whether patient is getting what is prescribed. Mother answered yes to every medication on the list as well as to medications that were not on the list. Asked her to bring medication bottles or photos of the bottles to the next visit.  Follow-up:  2-4 weeks

## 2019-12-06 ENCOUNTER — Other Ambulatory Visit: Payer: Self-pay

## 2019-12-06 ENCOUNTER — Ambulatory Visit (INDEPENDENT_AMBULATORY_CARE_PROVIDER_SITE_OTHER): Payer: Medicaid Other | Admitting: Psychology

## 2019-12-06 ENCOUNTER — Ambulatory Visit (INDEPENDENT_AMBULATORY_CARE_PROVIDER_SITE_OTHER): Payer: Medicaid Other

## 2019-12-06 ENCOUNTER — Encounter (INDEPENDENT_AMBULATORY_CARE_PROVIDER_SITE_OTHER): Payer: Self-pay | Admitting: Pediatrics

## 2019-12-06 ENCOUNTER — Ambulatory Visit (INDEPENDENT_AMBULATORY_CARE_PROVIDER_SITE_OTHER): Payer: Medicaid Other | Admitting: Pediatrics

## 2019-12-06 VITALS — BP 104/64 | HR 104 | Ht 63.5 in | Wt 109.0 lb

## 2019-12-06 DIAGNOSIS — F79 Unspecified intellectual disabilities: Secondary | ICD-10-CM

## 2019-12-06 DIAGNOSIS — Z09 Encounter for follow-up examination after completed treatment for conditions other than malignant neoplasm: Secondary | ICD-10-CM | POA: Diagnosis not present

## 2019-12-06 DIAGNOSIS — R4689 Other symptoms and signs involving appearance and behavior: Secondary | ICD-10-CM | POA: Diagnosis not present

## 2019-12-06 DIAGNOSIS — Q043 Other reduction deformities of brain: Secondary | ICD-10-CM

## 2019-12-06 DIAGNOSIS — F902 Attention-deficit hyperactivity disorder, combined type: Secondary | ICD-10-CM

## 2019-12-06 DIAGNOSIS — G479 Sleep disorder, unspecified: Secondary | ICD-10-CM
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
## 2019-12-06 NOTE — Progress Notes (Signed)
Mom has not heard from PT referral. RN will follow up.  Would like for her to not return to school - has open house this week at [facility] and will determine if they will be virtual or does she need to be homebound due to not being able to wear a mask.   Call to [facility] where referral was sent about  PT for DME eval- they report they do not do equipment evals. Call [facility]- also do not see for DME Call to [facility]- they requested send a referral for owner to review and determine if they can work her in since would only be 1-2 visits  Release to obtain records faxed to [facility] and to obtain IEP from [name] school. Orders faxed to [facility] with referral form. Confirmation received
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
## 2019-12-06 NOTE — Patient Instructions (Addendum)
   Continue all medications as ordered  Patient to see Dr Huntley Dec today  Maralyn Sago will follow-up on physical therapy referral  Copies of equipment order and physical therapy referral provided to family  Release of information forms completed for [name] and [employer] to attempt to get prior evaluations and IEP again.

## 2019-12-07 ENCOUNTER — Encounter (INDEPENDENT_AMBULATORY_CARE_PROVIDER_SITE_OTHER): Payer: Self-pay

## 2019-12-07 NOTE — Progress Notes (Signed)
Faxed ROI to [employer] to obtain notes and testing results on Jadda Faxed ROI to [name] to obtain IEP on patient and determine what is required for her to do out of school learning. Consents for each completed and scanned into Epic

## 2019-12-18 ENCOUNTER — Ambulatory Visit: Payer: Medicaid Other

## 2019-12-18 ENCOUNTER — Telehealth: Payer: Self-pay

## 2019-12-18 NOTE — Telephone Encounter (Signed)
Please call mom back ASAP to reschedule appt.

## 2019-12-18 NOTE — Telephone Encounter (Signed)
Rescheduled. Mom interested in sooner appointment if we have a 4pm cancellation on any Tuesday with Dr. Marina Goodell.

## 2019-12-27 ENCOUNTER — Telehealth: Payer: Self-pay

## 2019-12-27 NOTE — Progress Notes (Signed)
A user error has taken place: This encounter has been opened in error and is being closed for administrative purposes.

## 2019-12-27 NOTE — Telephone Encounter (Signed)
Mom called asking for a letter to the school so that patient may receive virtual instruction this school year. Spoke with Dr. [name] and prepared letter, dropped off for MD signature. Letter taken to front office for mom to pick up tomorrow.

## 2020-01-03 ENCOUNTER — Ambulatory Visit (INDEPENDENT_AMBULATORY_CARE_PROVIDER_SITE_OTHER): Payer: Medicaid Other | Admitting: Pediatrics

## 2020-01-03 ENCOUNTER — Encounter (INDEPENDENT_AMBULATORY_CARE_PROVIDER_SITE_OTHER): Payer: Medicaid Other | Admitting: Psychology

## 2020-01-08 ENCOUNTER — Encounter (INDEPENDENT_AMBULATORY_CARE_PROVIDER_SITE_OTHER): Payer: Self-pay | Admitting: Pediatrics

## 2020-01-17 ENCOUNTER — Ambulatory Visit (INDEPENDENT_AMBULATORY_CARE_PROVIDER_SITE_OTHER): Payer: Medicaid Other | Admitting: Pediatrics

## 2020-02-05 ENCOUNTER — Ambulatory Visit: Payer: Medicaid Other

## 2020-02-07 ENCOUNTER — Telehealth (INDEPENDENT_AMBULATORY_CARE_PROVIDER_SITE_OTHER): Payer: Self-pay

## 2020-02-07 ENCOUNTER — Ambulatory Visit (INDEPENDENT_AMBULATORY_CARE_PROVIDER_SITE_OTHER): Payer: Medicaid Other | Admitting: Pediatrics

## 2020-02-07 ENCOUNTER — Ambulatory Visit (INDEPENDENT_AMBULATORY_CARE_PROVIDER_SITE_OTHER): Payer: Medicaid Other | Admitting: Dietician

## 2020-02-07 ENCOUNTER — Ambulatory Visit (INDEPENDENT_AMBULATORY_CARE_PROVIDER_SITE_OTHER): Payer: Medicaid Other

## 2020-02-07 NOTE — Telephone Encounter (Signed)
Call to [employer] for follow up on the bed that was ordered in July-Aug. He cannot pull the patient up which has been a problem previously- He is going to try to find her in the system and will call RN back.11/27/2019 Last note RN received from Myrene Buddy was that they had received it and were sending it to sales person and they would contact the mother.

## 2020-02-07 NOTE — Telephone Encounter (Signed)
Misty [name] at [phone]

## 2020-02-14 ENCOUNTER — Ambulatory Visit (INDEPENDENT_AMBULATORY_CARE_PROVIDER_SITE_OTHER): Payer: Medicaid Other | Admitting: Pediatrics

## 2020-02-21 ENCOUNTER — Ambulatory Visit (INDEPENDENT_AMBULATORY_CARE_PROVIDER_SITE_OTHER): Payer: Medicaid Other | Admitting: Pediatrics

## 2020-02-21 ENCOUNTER — Ambulatory Visit (INDEPENDENT_AMBULATORY_CARE_PROVIDER_SITE_OTHER): Payer: Medicaid Other | Admitting: Dietician

## 2020-02-27 ENCOUNTER — Ambulatory Visit (INDEPENDENT_AMBULATORY_CARE_PROVIDER_SITE_OTHER): Payer: Medicaid Other | Admitting: Psychologist

## 2020-02-27 ENCOUNTER — Other Ambulatory Visit: Payer: Self-pay

## 2020-02-27 DIAGNOSIS — F89 Unspecified disorder of psychological development: Secondary | ICD-10-CM
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
## 2020-03-04 ENCOUNTER — Ambulatory Visit: Payer: Medicaid Other

## 2020-03-04 ENCOUNTER — Telehealth: Payer: Self-pay

## 2020-03-04 ENCOUNTER — Encounter: Payer: Self-pay | Admitting: Pediatrics

## 2020-03-04 NOTE — Telephone Encounter (Signed)
Parent called asking for call back. Patient no showed appointment today. Routing to [place]. Likely needs rescheduling.

## 2020-03-04 NOTE — Telephone Encounter (Signed)
Spoke with mom and rescheduled visit. Per mom, Annjanette was hitting and scratching her on the way to the office and she had to pull over three times, so she didn't make it to today's appointment. She wanted to make sure Dr. Marina Goodell was aware.

## 2020-03-04 NOTE — Telephone Encounter (Signed)
Call to [facility] number given reached Myrene Buddy and she transferred me to her number- left message for her to follow up on the bed order

## 2020-03-11 ENCOUNTER — Telehealth (INDEPENDENT_AMBULATORY_CARE_PROVIDER_SITE_OTHER): Payer: Self-pay | Admitting: Pediatrics

## 2020-03-11 NOTE — Telephone Encounter (Signed)
Mom called in asking to speak with Faby. Declined to give reason for call. Call back number is [phone].

## 2020-03-11 NOTE — Telephone Encounter (Signed)
See new encounter- referral closed DME left several messages and mom did not return calls.

## 2020-03-12 NOTE — Telephone Encounter (Signed)
Called patient's family and left voicemail for family to return my call when possible.   

## 2020-03-13 ENCOUNTER — Ambulatory Visit (INDEPENDENT_AMBULATORY_CARE_PROVIDER_SITE_OTHER): Payer: Medicaid Other | Admitting: Psychologist

## 2020-03-13 DIAGNOSIS — F89 Unspecified disorder of psychological development: Secondary | ICD-10-CM | POA: Diagnosis not present

## 2020-03-13 NOTE — Progress Notes (Signed)
  Ahni Bradwell  [id number]  Medicaid Identification Number [id number] S  03/13/20  Psychological testing Face to face time start: 1:55  End:2:45  Purpose of Psychological testing is to help finalize unspecified diagnosis  Today's appointment is one of a series of appointments for psychological testing. Results of psychological testing will be documented as part of the note on the final appointment of the series (results review).  Tests completed during previous appointments: Intake  Individual tests administered: Bayley-4  Shaquan engaged in most items presented. With picture cards, she had a tendency to gather and give to this examiner rather than wait for or follow instructions. Anayah performed better when instruction was modeled for her rather than following oral directions alone. She engaged with manipulatives well, presented with shared enjoyments, provided eye contact and directed facial expressions of excitement, joy, curiosity, and frustration. When frustrated and wanting to leave the space, [name] would pull or push on her mother. She responded to redirection and completed remaining items. Niki imitated some animal sounds when manipulating animal figurines and she imitated two words upon request. Davinia rocked in her seat intermittently, at times vigorously, and vocalized while smiling at times. Ashtynn nodded her Tulio Facundo "yes" when asked if she was all done with a particular task. Santanna readily imitated tasks and presented with linear development of skills.  This date included time spent performing: reasonable review of pertinent health records = 2 hours performing the authorized Psychological Testing = 45 mins scoring the Psychological Testing = 15 mins  Total amount of time to be billed on this date of service for psychological testing  3 hours  Plan/Assessments Needed: ADOS-2 Module 1  Interview Follow-up: N/A  CMA next appointment set-up: Room set-up:  Older Child Assessment set-up: ADOS-2 Module 1  Renee Pain. Sabriah Hobbins, LPA [place] Licensed Psychological Associate [phone]  Psychologist Tim and [facility] for Child and Adolescent [facility] [address] [place], [place] [address]   [phone]  Office [phone]  Fax

## 2020-03-18 ENCOUNTER — Ambulatory Visit (INDEPENDENT_AMBULATORY_CARE_PROVIDER_SITE_OTHER): Payer: Medicaid Other | Admitting: Pediatrics

## 2020-03-18 DIAGNOSIS — Z636 Dependent relative needing care at home: Secondary | ICD-10-CM | POA: Diagnosis not present

## 2020-03-18 DIAGNOSIS — F919 Conduct disorder, unspecified: Secondary | ICD-10-CM | POA: Diagnosis not present
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
## 2020-03-20 ENCOUNTER — Other Ambulatory Visit: Payer: Self-pay

## 2020-03-20 ENCOUNTER — Ambulatory Visit: Payer: Medicaid Other | Admitting: Psychologist

## 2020-03-20 DIAGNOSIS — F89 Unspecified disorder of psychological development: Secondary | ICD-10-CM | POA: Diagnosis not present

## 2020-03-20 NOTE — Progress Notes (Addendum)
  Misty [name]  [id number]  Medicaid Identification Number [id number] S  03/20/20  Psychological testing Face to face time start: 1:30  End:2:15  Purpose of Psychological testing is to help finalize unspecified diagnosis  Today's appointment is one of a series of appointments for psychological testing. Results of psychological testing will be documented as part of the note on the final appointment of the series (results review).  Tests completed during previous appointments: Intake Bayley-4  Individual tests administered: ADOS-2 Module 1  This date included time spent performing: performing the authorized Psychological Testing = 45 mins scoring the Psychological Testing = 30 mins  Total amount of time to be billed on this date of service for psychological testing  1.5 hours  Plan/Assessments Needed: Clinical Interview CARS-2 [place] Interview  Interview Follow-up: N/A  Misty [name]. Misty [name], LPA [place] Licensed Psychological Associate [phone]  Psychologist Tim [facility] for Child and Adoles[facility] [address] [place], [place] [address]   [phone]  Office [phone]  Fax

## 2020-03-24 ENCOUNTER — Encounter: Payer: Self-pay | Admitting: Pediatrics

## 2020-03-24 ENCOUNTER — Telehealth: Payer: Self-pay

## 2020-03-24 ENCOUNTER — Ambulatory Visit (INDEPENDENT_AMBULATORY_CARE_PROVIDER_SITE_OTHER): Payer: Medicaid Other | Admitting: Pediatrics

## 2020-03-24 VITALS — Temp 97.6°F | Wt 109.2 lb

## 2020-03-24 DIAGNOSIS — F89 Unspecified disorder of psychological development: Secondary | ICD-10-CM | POA: Diagnosis not present

## 2020-03-24 DIAGNOSIS — M419 Scoliosis, unspecified: Secondary | ICD-10-CM

## 2020-03-24 DIAGNOSIS — F79 Unspecified intellectual disabilities: Secondary | ICD-10-CM | POA: Diagnosis not present

## 2020-03-24 NOTE — Progress Notes (Signed)
Subjective:    Misty [name] is a 19 y.o. female accompanied by mother presenting to the clinic today with a chief c/o of ongoing back issues. Mom is worried that Misty [name] is having back pain frequently as she seems to hold her back & cry & at times can be crying for 45 min to 1 hour. She usually points to her lower back when she cries. She has been seen by Orthopedics at [facility] in the past by Dr Clovis Riley. Mom would like imaging of her back to see if she has any abnormality.  Patient does not currently receiving any physical therapy as they had to discontinue therapy from various locations as patient has been aggressive with the therapist.  Mom has been trying to do exercises at home and has some exercise equipment at home. Misty [name] is currently being seen by Psychologist [facility] for a series of tests to assess intellectual disability in order to qualify for CAP services.  Patient has a complex medical history significant for global delays, ADHD, hip tilts, aggressive behavior and intellectual disability.  Patient is still waitlisted for [employer] program. Mom is also requesting paperwork for personal care services.  She previously had personal care services but currently mom is trying to get certified to become her caregiver as she does not want any outside staff to come in to help her.  This is mostly due to Misty [name]'s aggressive behavior and she is afraid that she might hurt the caregiver.  Review of Systems  Constitutional: Negative for activity change, appetite change, fatigue and fever.  HENT: Negative for congestion.   Respiratory: Negative for cough, shortness of breath and wheezing.   Gastrointestinal: Negative for abdominal pain, diarrhea, nausea and vomiting.  Genitourinary: Negative for dysuria.  Skin: Negative for rash.  Neurological: Negative for headaches.  Psychiatric/Behavioral: Negative for sleep disturbance.       Objective:   Physical  Exam Vitals and nursing note reviewed.  Constitutional:      General: She is not in acute distress.    Comments: Nonverbal, uncooperative with exam. shredding paper during the entire visit  HENT:     Head: Normocephalic and atraumatic.     Right Ear: External ear normal.     Left Ear: External ear normal.  Eyes:     General:        Right eye: No discharge.        Left eye: No discharge.     Conjunctiva/sclera: Conjunctivae normal.  Cardiovascular:     Rate and Rhythm: Normal rate and regular rhythm.     Heart sounds: Normal heart sounds.  Pulmonary:     Effort: No respiratory distress.     Breath sounds: No wheezing or rales.  Musculoskeletal:     Cervical back: Normal range of motion.     Comments: Thoracolumbar scoliosis noted with abnormal gait.  Difficult exam patient did not allow palpation of the spine  Skin:    General: Skin is warm and dry.     Findings: No rash.  Neurological:     Gait: Gait abnormal.    .Temp 97.6 F (36.4 C) (Temporal)   Wt 109 lb 3.2 oz (49.5 kg)   BMI 19.04 kg/m         Assessment & Plan:  1. Scoliosis of thoracolumbar spine with abnormal gait Parental concern for lower back pain.  Patient is nonverbal  We will make a referral to orthopedics team at [facility] so she  could also get PT services.  Mom would like to try [facility] for PT services as patient has been discharged from most practices in [place]. Mom kept insisting that patient needs an MRI of the spine.  I noted that there was no indication at this time but will let her be evaluated by the specialist first.  No imaging has been ordered at this time - Ambulatory referral to Orthopedics  2. Neurodevelopmental disorder  3. Intellectual disability Continue current medication as well as follow-up with complex care clinic.  Also keep appointment with psychologist to complete the testing. We will also complete paperwork for personal care services for mom to pick up  tomorrow   The visit lasted for 35 minutes and > 50% of the visit time was spent on counseling regarding the treatment plan and importance of compliance with chosen management options. Return in about 3 months (around 06/24/2020) for well visit with Ruben Mahler.  Tobey Bride, MD 03/25/2020 2:58 PM

## 2020-03-24 NOTE — Telephone Encounter (Signed)
Called mother back. Mother was unable to hear caller and line was disconnected. Attempted to call back again and phone rang once and was disconnected. Will try back soon.

## 2020-03-24 NOTE — Telephone Encounter (Signed)
Was able to get back in touch with mother. Mother had discussed a letter she needed for home care  during [name]'s appt with Dr. [name] today. Mother would like to ensure Dr. [name] knows [name] can wake up during the night up to eight times and requires around the clock care, 24 hrs a day. Mother states she would like to be able to pick this letter up from the front desk by 11 am tomorrow morning. RN advised mother she will pass message for letter onto Dr. [name].

## 2020-03-24 NOTE — Patient Instructions (Signed)
We will make a new referral to Ortho & Physical therapy as Zeba will benefit from PT.  Please keep all her specialty appointments.

## 2020-03-24 NOTE — Telephone Encounter (Signed)
Mom would like a call back. She did not relay what they call back is for.

## 2020-03-25 NOTE — Telephone Encounter (Signed)
Copy made of letter and sent to be scanned into EMR. Letter and supporting notes enclosed in envelope per mother's request and left with front desk staff for mother to pick up today.

## 2020-03-25 NOTE — Telephone Encounter (Signed)
Letter was placed in orange pod RN box  Tobey Bride, MD Pediatrician [facility] [address] [place], [place] [address] Ph: [phone] Fax: [phone] 03/25/2020 1:21 PM

## 2020-04-03 ENCOUNTER — Ambulatory Visit (INDEPENDENT_AMBULATORY_CARE_PROVIDER_SITE_OTHER): Payer: Medicaid Other | Admitting: Dietician

## 2020-04-03 ENCOUNTER — Other Ambulatory Visit: Payer: Self-pay

## 2020-04-03 ENCOUNTER — Ambulatory Visit (INDEPENDENT_AMBULATORY_CARE_PROVIDER_SITE_OTHER): Payer: Medicaid Other | Admitting: Pediatrics

## 2020-04-03 ENCOUNTER — Ambulatory Visit: Payer: Medicaid Other | Admitting: Psychologist

## 2020-04-03 DIAGNOSIS — F89 Unspecified disorder of psychological development: Secondary | ICD-10-CM | POA: Diagnosis not present

## 2020-04-03 NOTE — Progress Notes (Signed)
  Bernadette Gores  [id number]  Medicaid Identification Number [id number] S  05/08/20  Psychological testing Face to face time start: 2:30  End:3:30  Purpose of Psychological testing is to help finalize unspecified diagnosis  Today's appointment is one of a series of appointments for psychological testing. Results of psychological testing will be documented as part of the note on the final appointment of the series (results review).  Tests completed during previous appointments: Intake Bayley-4 ADOS-2 Module 1  Individual tests administered: Started Clinical Interview Started CARS-2 Started [place] Interview  Video interpreter utilized   This date included time spent performing: Clinical interview = 30 mins performing the authorized Psychological Testing = 30 mins  Total amount of time to be billed on this date of service for psychological testing  1 hour  Plan/Assessments Needed: Complete assessments started today  Interview Follow-up: N/A  Renee Pain. Shellsea Borunda, LPA [place] Licensed Psychological Associate [phone]  Psychologist Tim and [facility] for Child and Adolescent Health [address] [place], [place] [address]   [phone]  Office [phone]  Fax

## 2020-04-07 ENCOUNTER — Telehealth: Payer: Self-pay

## 2020-04-07 NOTE — Telephone Encounter (Signed)
Darnelle Maffucci, CAPC/CAPDA for [facility] [phone] Corlis Leak, superviser [phone]  I clarified meaning of yes/no question with Ms. Adrian Blackwater and provided mom with revised form signed by Dr. [name]. Copy placed in scan folder.

## 2020-04-10 ENCOUNTER — Ambulatory Visit: Payer: Medicaid Other | Admitting: Psychologist

## 2020-04-10 DIAGNOSIS — F89 Unspecified disorder of psychological development: Secondary | ICD-10-CM | POA: Diagnosis not present

## 2020-04-10 NOTE — Progress Notes (Signed)
  Misty [name]  [id number]  Medicaid Identification Number [id number] S  04/10/20  Psychological testing Face to face time start: 1:45  End:2:30  Today's appointment is one of a series of appointments for psychological testing. Results of psychological testing will be documented as part of the note on the final appointment of the series (results review).  Tests completed during previous appointments: Bayley 4 ADOS-2 Module 1 Started Clinical Interview Started CARS-2 Started [place]eland Interview  Individual tests administered: Finish Clinical Interview [employer] [place] Interview  This date included time spent performing: clinical interview = 15 mins performing the authorized Psychological Testing = 30 mins scoring the Psychological Testing = 30 mins integration of patient data = 30 mins interpretation of standard test results and clinical data = 1 hour clinical decision making = 30 mins treatment planning and report = 4 hours  Total amount of time to be billed on this date of service for psychological testing  7 hours  Plan/Assessments Needed: Results Review  Interview Follow-up: Follow-up on case management for CAP service support. Chart CC'd  Renee Pain. Giulianna Rocha, LPA [place] Licensed Psychological Associate [phone] Psychologist Tim and [facility] for Child and Adolescent Health [address] [place], [place] [address]   [phone]  Office [phone]  Fax

## 2020-04-15 ENCOUNTER — Other Ambulatory Visit (INDEPENDENT_AMBULATORY_CARE_PROVIDER_SITE_OTHER): Payer: Self-pay | Admitting: Family

## 2020-04-15 MED ORDER — CYPROHEPTADINE HCL 4 MG PO TABS: 4.0000 mg | ORAL_TABLET | Freq: Three times a day (TID) | ORAL | 3 refills | Status: DC
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
## 2020-05-07 ENCOUNTER — Ambulatory Visit: Payer: Medicaid Other | Admitting: Psychologist

## 2020-05-07 DIAGNOSIS — F840 Autistic disorder: Secondary | ICD-10-CM | POA: Diagnosis not present

## 2020-05-07 DIAGNOSIS — F73 Profound intellectual disabilities: Secondary | ICD-10-CM

## 2020-05-07 NOTE — Patient Instructions (Signed)
RECOMMENDATIONS 1. Applied behavior analysis (ABA) services/behavioral consultation/parent training: Implementing behavioral and educational strategies for bolstering social and communication skills and managing challenging behaviors at home and school will likely prove beneficial. As such, Swannie's parent and service providers are encouraged to implement ABA techniques targeting effective ways to increase social and communication skills across settings. The use of visual schedules and supports within this plan is recommended. Delynda's parent should also consider bolstering the services by accessing parent training opportunities (below), wherever possible. In addition, some families may be able to access a private Careers information officer (i.e. ABA consultant, board-certified behavior analyst (BCBA)) to help develop and monitor interventions; however, such professionals are often hard to locate and may not be covered by insurance providers. Despite these challenges, I would recommend establishing a relationship with a private ABA consultant if possible and as resources allow. For more information on finding ABA services in this area see resources listed below. ABA Therapy Locations in [facility]    They offer ABA therapy for children with Autism    Services offered In-home and in-clinic    Accepts all major insurance including medicaid    They do not currently have a waiting list (Sept 2020)   They can be reached at [phone]   [facility]    Does not take Medicaid, does take several private insurances   Serves [place] and several other areas in [place]   For more information go to www.butterflyeffects.com or call [phone]  [facility] [place] Offers in-home, in-clinic, or in-school one-on-one ABA therapy for children diagnosed with Autism Currently no wait list Accepts most insurance, medicaid, and private pay To learn more, contact Yetta Glassman, Behavior Analyst at  [phone] Asher Muir) [phone] (fax) [email or web address] .[email or web address] (email) [email or web address].[email or web address]   (website)  [facility] [place] in [place] but services [facility], provides additional financial assistance programs and sliding fee scale.    For more information go to ComedyHappens.es or call [phone]  [facility] [place] in [place] but [place] Medicaid   For more information go to www.abridgetoachievement.com or call [phone]    Can also reach them by fax at [phone] - Secure Fax - or by email at [email or web address]        [facility]    Serves [place], and [place] areas   Accepts Medicaid  For more information go to www.alternativebehaviorstrategies.com or call [phone] (general office) or [phone] [facility] office)        [facility] Services, [place] Medicaid Therapists are [place] or behavior technicians Patient can call to self-refer, there is an 8 month-1 year wait list Phone [phone] Fax [phone] Email [email or web address]         Priorities ABA    Tricare and [place] health plan for teachers and state employees only   Have a [place] and [place] branch, as well as others. www.prioritiesaba.com or call [phone]  2. Parent instruction: It will be important for Icesis to receive intervention services on an ongoing basis. As part of this intervention program, it is imperative that parents receive instruction and training in bolstering Donnalynn's social and communication skills as well as managing challenging behavior. See suggestions below:  [place] program founded by [employer] that offers numerous clinical services including support groups, recreation groups, counseling, parent training, and evaluations.  They  also  offer evidence based interventions, such as Structured TEACCHing:         "Structured TEACCHing is an evidence-based intervention framework developed at [facility] (PharmaceuticalAnalyst.pl) that is based on the learning differences typically associated with ASD. Many individuals with ASD have difficulty with implicit learning, generalization, distinguishing between relevant and irrelevant details, executive function skills, and understanding the perspective of others. In order to address these areas of weakness, individuals with ASD typically respond very well to environmental structure presented in visual format. The visual structure decreases confusion and anxiety by making instructions and expectations more meaningful to the individual with ASD. Elements of Structured TEACCHing include visual schedules, work or activity systems, Designer, television/film set, and organization of the physical environment." - [place]  Their main office is in [place] but they have regional centers across the state, including one in [place]. Main Office Phone: [phone] [facility] Office: [address], [place] [address], [place], [place] [address].  [place] Phone: [phone]   The [place] of [place] in [place] offers direct instruction on how to parent your child with autism.  ABC [facility]! Individualized family sessions for parents/caregivers of children with autism. Gain confidence using autism-specific evidence-based strategies. Feel empowered as a caregiver of your child with autism. Develop skills to help troubleshoot daily challenges at home and in the community. Family Session: One-on-one instructional sessions with child and primary caregiver. Evidence-based strategies taught by trained autism professionals. Focus on: social and play routines; communication and language; flexibility and coping; and adaptive living and self-help. Financial Aid Available See Family Sessions:ABC Go! On the their  website: https://www.powers-gomez.info/ Contact Duwaine Maxin at [phone], ext. [phone] or [email or web address]   [facility] [place] also offers FREE weekly classes, often with a focus on addressing challenging behavior and increasing developmental skills. http://ward-kane.com/  Autism Society of [place] - offers support and resources for individuals with autism and their families. They have specialists, support groups, workshops, and other resources they can connect people with, and offer both local (by county) and statewide support. Please visit their website for contact information of different county offices. https://www.autismsociety-[place].org/  After the Diagnosis Workshops:   "After the Diagnosis:  Helping an Older Child Navigate the Journey" for parents of ages 44+ and it is typically held bi-monthly on a Tuesday morning as well.  The eventbrite link is always online at the calendar link   https://www.autismsociety-[place].org/calendar/ and if parents are not able to access our 'live' trainings, they can also access free online training at https://www.autismsociety-[place].org/autism-webinars/    Also offered is a great series of free toolkits for parents available at https://www.autismsociety-[place].org/toolkits/  3. Caregiver support/advocacy: It can be very helpful for parents of children with autism to establish relationships with parents of other children with autism who already have expertise in negotiating the realm of intervention services.  In this regard your family is encouraged to contact the following resources below:  Autism Society of [place] - offers support and resources for individuals with autism and their families. They have specialists, support groups, workshops, and other resources they can connect people with, and offer both local (by county) and statewide support. Please visit their website for contact information of  different county offices. https://www.autismsociety-[place].org/  [facility]: [address] [place], [place] [address].  [place] Phone: [phone], ext. [phone].              State Office: [address] [place], [place],  [address].  State Phone: [phone] ADVOCACY through the [place] of [place] :  Welcome! Vevelyn Royals, Lajoyce Corners, and Bonnell Public are the [facility] for the [employer] region of the state. ASNC has 78 Autism Paediatric nurse across [place]. Please contact a local Autism Resource Specialist (ARS) if you need assistance finding resources for your family member on the spectrum. Our General Advocacy Line in the [place] office is [phone], or you can contact us at: [facility]              [email or web address]' -[email or web address].fi       [phone] x [phone] [place]   [email or web address] -[email or web address].fi   [phone] x [place]   [email or web address] -[email or web address].fi            [phone]  Autism Unbound - A non-profit organization in [place] that provides support for the autism community in areas such as Personnel officer, education and training, and housing. Autism Unbound offers support groups, newsletters, parent meetings, and family outings. http://autismunbound.org/   Every month during the school year, here is what Autism Unbound offers our community.  COFFEE BREAK Usually held the third Thursday of the month at [facility], this is an opportunity to talk with others with children on the spectrum. MOMS' NIGHT OUT Usually held the third Tuesday of every month, this is a chance for mothers of children with autism to spend an evening together, enjoy a meal, and talk. MONTHLY MEETING Sometimes it's an informative meeting with a guest speaker, sometimes it's a potluck, sometimes it's a roundtable discussion, but it's always a way to connect with others. Usually held at on the second Thursday of the  month [place] Every month, we schedule an event that's free for our families. Past activities have included movies, miniature golf, bowling, [place]'s Festival Of Lights, [place] [address] [place], [place], and more! For support, volunteer opportunities, partnership opportunities, donations, and other requests or questions, please contact: Haynes Bast [phone] [email or web address] .[email or web address]  4. Resources: The following books and websites are recommended for your family to learn more about effective interventions with children with autism spectrum disorders: Teaching Social Communication to Children with Autism: A Manual for Parents by Katrine Coho & Scherrie Merritts  Visual Supports for People with Autism: A Guide for Parents and Professionals by Anitra Lauth and [place] resources and information for individuals with autism and their families. Specifically, the 100 Days Kit is a useful resource that helps parents and families navigate the first few months after a child receives an autism diagnosis. There are also several other tool kits, all free of charge, and resources provided on the website for topics ranging from dental visits, IEPs, and sleep. https://www.autismspeaks.org/  The family is encouraged to search www.autismspeaks.org for the 100 Day Kit regarding useful ideas to assist families in getting through first steps once a child is identified with autism/autism spectrum disorder. The 100 Day Kit can be found by clicking on [employer] & then Tools for Families.   At Autism Speaks, an Autism Response Team (ART) is available.  They information about the early signs of autism, special education advocacy, resources and services for adults on the spectrum, financial planning or anything in between.  You can contact ART by calling 1-888 AUTISM2 [phone]), en Espaol: [phone], or by emailing [email or web address] .org.  The Arc of [place] -  This nonprofit organization provides services, advocacy, and programs for individuals with intellectual and developmental disabilities. They have 20 chapters  located across the state, including [place], [place], and [place]. Local events vary by location, but offerings range from workshops and fundraisers, to sports leagues and arts groups. Information and links to regional chapters can be found on the Arc's main website.   Arc of [place] website: Inrails.tn    Phone: [place] website: EmbassyBlog.es   [phone]   Address: [address] [place], [place] [address]  State Services and [place] for CAP (Community Alternatives Program) Music therapist for Disabled Adults (CAP/DA)  [place] Medicaid (CompanySummit.is)  Be sure to acquaint yourself with the term [facility] (Managed Care Organization). The [facility] in [place] serve families in the areas of mental illness and developmental disabilities. They have a network of service providers offering programs including: respite services, crisis intervention, inpatient treatment, outpatient treatment, in-home services and residential services.   The Innovations Waiver, a home/community-based Medicaid program, is among these services. Not everyone with a diagnosis of ASD (Autism Spectrum Disorder) qualifies for an Tax adviser. Those individuals who do qualify may be placed on the Registry of Unmet Needs (a wait list). We recommend that you call your area [facility] to start the application process whether or not you think your child will qualify.   MCO's In the [place] of [place]:  Det[place].org ([place], [place], [place], [place], [place] and [place]) [place], [place]  [address] Phone:  [phone] 24-hour Access/ Crisis Number: [phone]  Easterseals/UCP Individual Community  Supports: Community Skills are supports that enable an individual to acquire and maintain skills that will allow them to function with greater independence in the community. Catering manager, Personal Care and Respite supports provide one-to-one support for personal care and daily living skills to individuals with disabilities.  Publishing copy a child or adult's ability to participate in the life of his or her community.  Personal Care Services are provided in the home and may include assistance with bathing, dressing, and meal preparation. Respite is a support that provides periodic relief for the family or primary caregiver of an individual by supervising and caring for that individual when the caregiver needs a break of the care giving responsibilities. [place], [place]  [phone] [place]: [place], [place], [place], [place], [place], [place]  Transitioning to [place] of [place] -  Start here for more information: www.autismsociety-[place].org  Toolkits:  http://www.autismsociety-[place].org/toolkits/  - Midwife    - [place] - IEP:  Transition Component - Residential Options Toolkit   Transitioning to Adulthood -  -http://www.autismsociety-[place].org/index.php/get-help/families/autism-adulthood-transition - Developing ITP - Guardianship - Planning for Residential Placement  Disability Resources :  MassFlood.es http://www.http://white-smith.org/.phtml  Social Security:  . SSA PowderCalcium.fr . SSA Publications StickerEmporium.tn.aspx?code=HR0067 . Bluebook - Disability Evaluation - https://www.park-gutierrez.org/ . Redbook: A Guide to Work Incentives   HDTVMall.com.ee  Residential Options Information: . ASNC Residential Options toolkit:  https://www.autismsociety-[place].org/wp-content/uploads/Residential-Options-Toolkit.pdf . Arc of [place]:  DirectoryZip.se . [place] [facility] for Independent Living:  http://www.flores-li.com/

## 2020-05-07 NOTE — Progress Notes (Unsigned)
  Misty [name]  [id number]  Medicaid Identification Number [id number] S  05/07/20  Psychological testing Face to face time start: 1:59  End: 2:30  Purpose of Psychological testing is to help finalize unspecified diagnosis  Results Review Appointment See diagnostic summary below. A copy of the full Psychological Evaluation Report is able to be accessed in OnBase via [employer]  Tests completed during previous appointments: Bayley 4 ADOS-2 Module 1 Clinical Interview CARS-2 Vineland Interview  This date included time spent performing: interactive feedback to the patient, family member/caregiver = 31 mins  Video interpreter utilized  Total amount of time to be billed on this date of service for psychological testing  1 hour  Plan/Assessments Needed: Mother was given report summary and recommendations today. She will be provided with final report at follow-up with Dr. [name] end of month which is integrated with case management. Coordinate messages with Dr. [name], Dr. 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 Limited information was able to be gathered regarding Misty [name]'s history and the family has several barriers to accessing supports and services. Evaluation results are consistent with intellectual disability based on Bayley-4 (estimate at 22 months) and adaptive behavior interview results (all standard scores below 20). When considering all information provided in this psychological evaluation, Misty [name] meets the diagnostic criteria for autism spectrum disorder. Misty [name] presents with difficulty in social emotional reciprocity, nonverbal communication skills, and developing/maintaining social relationships that is atypical at  a 66 month-old level. She also presents with extreme stereotyped behaviors, behavioral rigidity, and self-directed aggression, as well as, aggression towards others. Many ASD symptoms were reported during the clinical interview, which is consistent with CARS 2-ST examiner ratings based on parent interview falling within the Severe Symptoms of ASD range. Misty [name] presented with ASD symptoms consistent with the DSM-V during ADOS-2 tasks.   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 Misty [name], LPA [place] Licensed Psychological Associate [phone] Psychologist Tim and [facility] for Child and Adolescent Health [address] [place], [place] [address]   [phone]  Office [phone]  Fax

## 2020-05-08 ENCOUNTER — Ambulatory Visit (INDEPENDENT_AMBULATORY_CARE_PROVIDER_SITE_OTHER): Payer: Medicaid Other | Admitting: Dietician

## 2020-05-08 ENCOUNTER — Ambulatory Visit (INDEPENDENT_AMBULATORY_CARE_PROVIDER_SITE_OTHER): Payer: Medicaid Other

## 2020-05-08 ENCOUNTER — Ambulatory Visit (INDEPENDENT_AMBULATORY_CARE_PROVIDER_SITE_OTHER): Payer: Medicaid Other | Admitting: Pediatrics

## 2020-05-13 ENCOUNTER — Telehealth: Payer: Self-pay

## 2020-05-13 DIAGNOSIS — G9349 Other encephalopathy: Secondary | ICD-10-CM

## 2020-05-13 DIAGNOSIS — F919 Conduct disorder, unspecified: Secondary | ICD-10-CM

## 2020-05-13 DIAGNOSIS — Z8659 Personal history of other mental and behavioral disorders: Secondary | ICD-10-CM

## 2020-05-13 DIAGNOSIS — F840 Autistic disorder: Secondary | ICD-10-CM

## 2020-05-13 DIAGNOSIS — Z742 Need for assistance at home and no other household member able to render care: Secondary | ICD-10-CM

## 2020-05-13 DIAGNOSIS — R569 Unspecified convulsions: Secondary | ICD-10-CM

## 2020-05-13 DIAGNOSIS — R4689 Other symptoms and signs involving appearance and behavior: Secondary | ICD-10-CM

## 2020-05-13 DIAGNOSIS — F902 Attention-deficit hyperactivity disorder, combined type: Secondary | ICD-10-CM

## 2020-05-13 DIAGNOSIS — Q043 Other reduction deformities of brain: Secondary | ICD-10-CM

## 2020-05-13 DIAGNOSIS — R32 Unspecified urinary incontinence: Secondary | ICD-10-CM

## 2020-05-13 DIAGNOSIS — Z09 Encounter for follow-up examination after completed treatment for conditions other than malignant neoplasm: Secondary | ICD-10-CM

## 2020-05-13 NOTE — Telephone Encounter (Signed)
SWCM called mother and informed of [facility] referral, and also coordinated rescheduling of apt with Dr. Marina Goodell., new appt is 3/3 at 4pm.      Kenn File, BSW, QP Case Manager Tim and [employer] [facility] for Child and Adolescent Health Office: [phone] Direct Number: [phone]

## 2020-05-13 NOTE — Telephone Encounter (Signed)
Referred to Managed Medicaid Care Management.    Kenn File, BSW, QP Case Manager Tim and [employer] [facility] for Child and Adolescent Health Office: [phone] Direct Number: [phone]

## 2020-05-22 ENCOUNTER — Telehealth (INDEPENDENT_AMBULATORY_CARE_PROVIDER_SITE_OTHER): Payer: Self-pay | Admitting: Pediatrics

## 2020-05-22 ENCOUNTER — Ambulatory Visit: Payer: Medicaid Other

## 2020-05-22 NOTE — Telephone Encounter (Signed)
  Who's calling (name and relationship to patient) : Flint Melter - Care Coordinator  Best contact number: [phone]  Provider they see: Dr. [name]  Reason for call: Calling to check status of paperwork faxed on 1/18.    PRESCRIPTION REFILL ONLY  Name of prescription:  Pharmacy:

## 2020-05-28 NOTE — Telephone Encounter (Signed)
Misty [name] has called back regarding paperwork.

## 2020-05-29 ENCOUNTER — Telehealth (INDEPENDENT_AMBULATORY_CARE_PROVIDER_SITE_OTHER): Payer: Self-pay | Admitting: Pediatrics

## 2020-05-29 NOTE — Telephone Encounter (Signed)
Mom called asking to speak to Faby she gave me no idea of what te call was based on

## 2020-05-30 ENCOUNTER — Telehealth (INDEPENDENT_AMBULATORY_CARE_PROVIDER_SITE_OTHER): Payer: Self-pay | Admitting: Pediatrics

## 2020-05-30 NOTE — Telephone Encounter (Signed)
Called patient's family and left voicemail for family to return my call when possible.   

## 2020-05-30 NOTE — Telephone Encounter (Signed)
Who's calling (name and relationship to patient) : Misty [name] mom   Best contact number: [phone]  Provider they see: Dr. [name]  Reason for call: Mom is requesting a call back.  The pharmacy needs forms filled out.   Call ID:      PRESCRIPTION REFILL ONLY  Name of prescription:  Pharmacy:

## 2020-06-02 NOTE — Telephone Encounter (Signed)
Call back to [facility] about [employer] forms. Advised RN had spoken with mom today, MD has received the forms and will bring them to RN tomorrow to fax with the Autism paperwork. Confirmed fax number is [phone]. Advised RN will call her after faxing to confirm receipt.

## 2020-06-02 NOTE — Telephone Encounter (Signed)
Call to [facility] [address] Pharmacist reports they do not have any forms for this patient. She has Trazadone ready for pick up and Gabapentin, Abilify and Clonidine on hold from Dec not picked up. She reports appears they usually go to the [facility] and [place].  Call to mom to determine what forms she is referring to for the pharmacist. After speaking with mom RN determined she is referring to the forms for [facility]. RN advised MD reports the form is completed and will give to RN tomorrow to fax with the Autism testing information to [facility] with [place]. Mom reports the fax number is [phone]- call lasted 25 min. RN advised MD is to give her forms in tomorrow and will attach the Autism testing paperwork and fax to [facility]. RN will confirm it was received and let mom know.

## 2020-06-03 ENCOUNTER — Telehealth (INDEPENDENT_AMBULATORY_CARE_PROVIDER_SITE_OTHER): Payer: Self-pay

## 2020-06-03 ENCOUNTER — Encounter (INDEPENDENT_AMBULATORY_CARE_PROVIDER_SITE_OTHER): Payer: Self-pay

## 2020-06-03 NOTE — Telephone Encounter (Signed)
Faxed [employer] form, attached psychological testing, diagnosis lists, Dr. Blair Heys last office note- to [facility] [phone] Call to [facility] to confirm receipt. She reports the diagnosis information is not clear on the fax. RN requested she send a blank form and RN will scan into computer and complete it Completed in word document and resent

## 2020-06-03 NOTE — Telephone Encounter (Signed)
Call to mother- advised forms and required information was faxed to [facility] this morning RN called her and confirmed she received it and that it was completed correctly. RN advised mom to make sure she answers if they call to schedule a time for an assessment. Mom reports when she cancels her appointment with Dr. [name] because patient is not feeling well they will not give her an appointment in our office on the next day. RN explained several times that all providers have visits scheduled months in advance. Dr. [name] sees the Complex Care/CM patients only on Thursdays and those slots are full month in advance unless someone cancels. The front office is correct when they tell her there are no appointments until 1-2 months later and when she cancels the day of the appointment the office cannot fill that appointment slot with another patient.  RN advised she should hear from [facility] within about a 2 week time frame if not she is to call Lashawn to follow up.

## 2020-06-04 ENCOUNTER — Other Ambulatory Visit (INDEPENDENT_AMBULATORY_CARE_PROVIDER_SITE_OTHER): Payer: Self-pay | Admitting: Pediatrics

## 2020-06-04 ENCOUNTER — Telehealth (INDEPENDENT_AMBULATORY_CARE_PROVIDER_SITE_OTHER): Payer: Self-pay | Admitting: Pediatrics

## 2020-06-04 DIAGNOSIS — G479 Sleep disorder, unspecified: Secondary | ICD-10-CM

## 2020-06-04 DIAGNOSIS — F902 Attention-deficit hyperactivity disorder, combined type: Secondary | ICD-10-CM

## 2020-06-04 NOTE — Telephone Encounter (Signed)
Who's calling (name and relationship to patient) : Misty [name] mom  Best contact number: [phone]  Provider they see: Dr. [name]  Reason for call: Mom would like to speak with [name]  Call ID:      PRESCRIPTION REFILL ONLY  Name of prescription:  Pharmacy:

## 2020-06-05 NOTE — Telephone Encounter (Signed)
Call to mom she has more questions about the [employer] Waiver paperwork- She questions if a letter was written and faxed to [facility] for the [employer]. RN advised in the past yes but RN faxed all of the required information listed on the letter and form sent to our office by [employer]. The form covered all of the information they could need related to her current self care abilities, recommendations, medications, diagnoses as well as recommended level of care. RN advised if they need further information they will contact our office.  Mom states understanding

## 2020-06-12 ENCOUNTER — Encounter (INDEPENDENT_AMBULATORY_CARE_PROVIDER_SITE_OTHER): Payer: Self-pay | Admitting: Pediatrics

## 2020-06-12 ENCOUNTER — Ambulatory Visit (INDEPENDENT_AMBULATORY_CARE_PROVIDER_SITE_OTHER): Payer: Medicaid Other | Admitting: Pediatrics

## 2020-06-12 ENCOUNTER — Other Ambulatory Visit: Payer: Self-pay

## 2020-06-12 ENCOUNTER — Ambulatory Visit (INDEPENDENT_AMBULATORY_CARE_PROVIDER_SITE_OTHER): Payer: Medicaid Other | Admitting: Dietician

## 2020-06-12 VITALS — BP 112/70 | HR 88 | Ht 63.5 in | Wt 113.6 lb

## 2020-06-12 DIAGNOSIS — K5900 Constipation, unspecified: Secondary | ICD-10-CM

## 2020-06-12 DIAGNOSIS — Q043 Other reduction deformities of brain: Secondary | ICD-10-CM

## 2020-06-12 DIAGNOSIS — R32 Unspecified urinary incontinence: Secondary | ICD-10-CM

## 2020-06-12 DIAGNOSIS — R899 Unspecified abnormal finding in specimens from other organs, systems and tissues: Secondary | ICD-10-CM | POA: Diagnosis not present

## 2020-06-12 DIAGNOSIS — F89 Unspecified disorder of psychological development: Secondary | ICD-10-CM

## 2020-06-12 DIAGNOSIS — R569 Unspecified convulsions: Secondary | ICD-10-CM

## 2020-06-12 DIAGNOSIS — G479 Sleep disorder, unspecified: Secondary | ICD-10-CM

## 2020-06-12 DIAGNOSIS — R4689 Other symptoms and signs involving appearance and behavior: Secondary | ICD-10-CM

## 2020-06-12 DIAGNOSIS — F840 Autistic disorder: Secondary | ICD-10-CM

## 2020-06-12 NOTE — Progress Notes (Signed)
   Medical Nutrition Therapy - Progress Note Appt start time: 3:30 PM Appt end time: 3:55 PM Reason for referral: Picky Eating Referring provider: Dr. 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 Mom accompanied pt to appt today. Pt with weight loss in 2021 and RD re-consulted. Given multiple no shows/cancellation, pt not seen until now - doing better.  Dietary Intake Hx: Usual eating pattern includes: 3 meals and 3-4 snacks per day. Eats at table with family in high chair with tray, no electronics present during meals. Preferred foods: chicken wings, boiled eggs, fish (salmon), fruits (mango, avocado, papya), vegetables Avoided foods: meat products (beef, pork, sausage or chicken nuggets), sweets (sugar, candy, chocolate, cake, white bread) or salt as they give her a rash on her arms and cause behavior issues 24-hr recall: 8 AM Breakfast: cereal OR eggs OR cultural bread - all with fruit and 2%-whole milk 10 AM: snack: biscuit OR cake 12-1 PM Lunch: rice with soup and lamb/goat 3 PM snack: boiled eggs Dinner: rice with meat and fruit (grapes, orange, apple, banana, watermelon, pineapple) Beverages: milk, water, smoothies sometimes  Physical Activity: active - walks for 20-25 minutes in the morning and then 30 minutes in the afternoon - daily depending on the weather  GI: constipation, but better with  medicaition  Estimated caloric, protein, and fluid intake likely meeting needs given stable wt.  Nutrition Diagnosis: (7/29) Stable nutritional status/ No nutritional concerns  Intervention: Discussed current diet and feeding. Discussed MVI. All questions answered, mom in agreement with plan. Recommendations: Picture of [employer] MVI provided to mom via AVS - provide 2 gummies per day with food.  Teach back method used.  Monitoring/Evaluation: Goals to Monitor: - Weight trends - PO intake  Total time spent in counseling: 25 minutes.

## 2020-06-12 NOTE — Patient Instructions (Signed)
Medications refilled today We will try to find an ABA company to work with her.   We will continue to work with [facility] to get her a spot
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
## 2020-06-13 ENCOUNTER — Telehealth (INDEPENDENT_AMBULATORY_CARE_PROVIDER_SITE_OTHER): Payer: Self-pay | Admitting: Pediatrics

## 2020-06-13 LAB — CBC WITH DIFFERENTIAL/PLATELET
Absolute Monocytes: 558 cells/uL (ref 200–950)
Basophils Absolute: 50 cells/uL (ref 0–200)
Basophils Relative: 0.8 %
Eosinophils Absolute: 37 cells/uL (ref 15–500)
Eosinophils Relative: 0.6 %
HCT: 40.1 % (ref 35.0–45.0)
Hemoglobin: 13.4 g/dL (ref 11.7–15.5)
Lymphs Abs: 2145 cells/uL (ref 850–3900)
MCH: 30.3 pg (ref 27.0–33.0)
MCHC: 33.4 g/dL (ref 32.0–36.0)
MCV: 90.7 fL (ref 80.0–100.0)
MPV: 10.9 fL (ref 7.5–12.5)
Monocytes Relative: 9 %
Neutro Abs: 3410 cells/uL (ref 1500–7800)
Neutrophils Relative %: 55 %
Platelets: 374 10*3/uL (ref 140–400)
RBC: 4.42 10*6/uL (ref 3.80–5.10)
RDW: 12.1 % (ref 11.0–15.0)
Total Lymphocyte: 34.6 %
WBC: 6.2 10*3/uL (ref 3.8–10.8)

## 2020-06-13 LAB — IRON,TIBC AND FERRITIN PANEL
%SAT: 40 % (calc) (ref 16–45)
Ferritin: 28 ng/mL (ref 16–154)
Iron: 146 ug/dL (ref 40–190)
TIBC: 367 mcg/dL (calc) (ref 250–450)

## 2020-06-13 LAB — COMPREHENSIVE METABOLIC PANEL
AG Ratio: 1.6 (calc) (ref 1.0–2.5)
ALT: 8 U/L (ref 6–29)
AST: 10 U/L (ref 10–30)
Albumin: 4.7 g/dL (ref 3.6–5.1)
Alkaline phosphatase (APISO): 58 U/L (ref 31–125)
BUN: 11 mg/dL (ref 7–25)
CO2: 28 mmol/L (ref 20–32)
Calcium: 10.1 mg/dL (ref 8.6–10.2)
Chloride: 107 mmol/L (ref 98–110)
Creat: 0.6 mg/dL (ref 0.50–1.10)
Globulin: 3 g/dL (calc) (ref 1.9–3.7)
Glucose, Bld: 81 mg/dL (ref 65–139)
Potassium: 4 mmol/L (ref 3.5–5.3)
Sodium: 142 mmol/L (ref 135–146)
Total Bilirubin: 1 mg/dL (ref 0.2–1.2)
Total Protein: 7.7 g/dL (ref 6.1–8.1)

## 2020-06-13 LAB — VITAMIN D 25 HYDROXY (VIT D DEFICIENCY, FRACTURES): Vit D, 25-Hydroxy: 70 ng/mL (ref 30–100)

## 2020-06-13 NOTE — Telephone Encounter (Signed)
Patient saw Dr. [name] yesterday, 06/12/20. She needs a two month follow up appointment with Dr. [name] and Georgiann Hahn. She needs to be scheduled in a one hour spot. I left mother a voicemail today and also placed a recall for her to call our office and schedule. [name]

## 2020-06-25 ENCOUNTER — Telehealth (INDEPENDENT_AMBULATORY_CARE_PROVIDER_SITE_OTHER): Payer: Self-pay | Admitting: Pediatrics

## 2020-06-25 ENCOUNTER — Encounter (INDEPENDENT_AMBULATORY_CARE_PROVIDER_SITE_OTHER): Payer: Self-pay | Admitting: Pediatrics

## 2020-06-25 NOTE — Telephone Encounter (Signed)
Who's calling (name and relationship to patient) : Care coordinator lashawn blu  Best contact number: [phone]  Provider they see: Dr. [name]  Reason for call: Received fax of informaiton and was invited to call back if there was anything else she needed help with. She has questions for Maralyn Sago turner. Please call back when possible   Call ID:      PRESCRIPTION REFILL ONLY  Name of prescription:  Pharmacy:

## 2020-06-26 ENCOUNTER — Ambulatory Visit: Payer: Medicaid Other | Admitting: Pediatrics
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
## 2020-07-01 NOTE — Telephone Encounter (Signed)
error 

## 2020-07-03 ENCOUNTER — Telehealth: Payer: Self-pay

## 2020-07-03 ENCOUNTER — Ambulatory Visit: Payer: Medicaid Other

## 2020-07-03 NOTE — Telephone Encounter (Signed)
Mom lvm to schedule follow up with Dr. Marina Goodell. Routed to [place].

## 2020-07-05 ENCOUNTER — Other Ambulatory Visit (INDEPENDENT_AMBULATORY_CARE_PROVIDER_SITE_OTHER): Payer: Self-pay | Admitting: Pediatrics

## 2020-07-05 DIAGNOSIS — F902 Attention-deficit hyperactivity disorder, combined type: Secondary | ICD-10-CM

## 2020-07-05 DIAGNOSIS — G479 Sleep disorder, unspecified: Secondary | ICD-10-CM

## 2020-07-07 ENCOUNTER — Telehealth: Payer: Self-pay

## 2020-07-07 NOTE — Telephone Encounter (Signed)
Mom came in wondering if someone could complete this forms for her. Yecenia has a p/e scheduled for later on this month but mom was needing forms be filled out by then. Mom's phone number is [phone].

## 2020-07-08 NOTE — Telephone Encounter (Signed)
Last PE was performed on 06/25/19. PE scheduled for 3/30. Simha PCP. Will keep form until [facility] adolescent appointment to discuss possibility of completion at this appointment.

## 2020-07-17 ENCOUNTER — Ambulatory Visit: Payer: Medicaid Other

## 2020-07-23 ENCOUNTER — Telehealth (INDEPENDENT_AMBULATORY_CARE_PROVIDER_SITE_OTHER): Payer: Self-pay | Admitting: Pediatrics

## 2020-07-23 ENCOUNTER — Ambulatory Visit: Payer: Medicaid Other | Admitting: Pediatrics

## 2020-07-23 NOTE — Telephone Encounter (Signed)
Who's calling (name and relationship to patient) : Heidi Dach from community support service   Best contact number: [phone]  Provider they see: Dr. [name]  Reason for call:  Calling Misty [name] back with more questions. Please call when possible.  Meeting with mom tomorrow and needs a little more information.  List of the doctors orders and over the counter medication orders form needed to be collected by mom. When mom handed in paperwork this information wasn't with it.   Call ID:      PRESCRIPTION REFILL ONLY  Name of prescription:  Pharmacy:

## 2020-07-30 ENCOUNTER — Ambulatory Visit: Payer: Medicaid Other | Admitting: Pediatrics

## 2020-08-06 ENCOUNTER — Other Ambulatory Visit (INDEPENDENT_AMBULATORY_CARE_PROVIDER_SITE_OTHER): Payer: Self-pay | Admitting: Pediatrics

## 2020-08-06 DIAGNOSIS — R4689 Other symptoms and signs involving appearance and behavior: Secondary | ICD-10-CM

## 2020-08-11 ENCOUNTER — Telehealth: Payer: Self-pay

## 2020-08-11 NOTE — Telephone Encounter (Signed)
Mom needs a call back to reschedule the appt with Dr Marina Goodell

## 2020-08-12 ENCOUNTER — Telehealth (INDEPENDENT_AMBULATORY_CARE_PROVIDER_SITE_OTHER): Payer: Self-pay | Admitting: Pediatrics

## 2020-08-12 ENCOUNTER — Encounter (INDEPENDENT_AMBULATORY_CARE_PROVIDER_SITE_OTHER): Payer: Self-pay | Admitting: Dietician

## 2020-08-12 NOTE — Telephone Encounter (Signed)
Who's calling (name and relationship to patient) : [facility]  Best contact number: [phone]  Provider they see: Dr. [name]  Reason for call: Renewal paperwork was faxed to office please fill out and return  Call ID:      PRESCRIPTION REFILL ONLY  Name of prescription:  Pharmacy:

## 2020-08-12 NOTE — Telephone Encounter (Signed)
Paperwork faxed and confirmed to [facility]

## 2020-08-21 ENCOUNTER — Ambulatory Visit: Payer: Medicaid Other

## 2020-09-04 ENCOUNTER — Encounter (INDEPENDENT_AMBULATORY_CARE_PROVIDER_SITE_OTHER): Payer: Self-pay

## 2020-09-25 ENCOUNTER — Ambulatory Visit: Payer: Medicaid Other | Admitting: Pediatrics

## 2020-10-01 ENCOUNTER — Other Ambulatory Visit (INDEPENDENT_AMBULATORY_CARE_PROVIDER_SITE_OTHER): Payer: Self-pay | Admitting: Pediatrics

## 2020-10-01 ENCOUNTER — Telehealth: Payer: Self-pay | Admitting: Pediatrics

## 2020-10-01 NOTE — Telephone Encounter (Signed)
Mom needs a call back to reschedule tomorrows appt

## 2020-10-02 ENCOUNTER — Ambulatory Visit: Payer: Medicaid Other

## 2020-10-04 IMAGING — CT CT RENAL STONE PROTOCOL
2 of 4 series · 15 of 46 positions shown, 17 images · non-contrast
Comparison: None.

CLINICAL DATA: Left lower rib or chest pain just beneath the breast
for the past 2 days. No reported injury. Clinical concern for a
hernia.

EXAM:
CT ABDOMEN AND PELVIS WITHOUT CONTRAST
TECHNIQUE: Multidetector CT imaging of the abdomen and pelvis was performed
following the standard protocol without IV contrast.

[Series 2: ap without · axial · non-contrast · 0.65mm/px · z∈[-404,-20]mm · 12 of 87 slices shown, 14 images]
[im 5/87  soft-tissue]
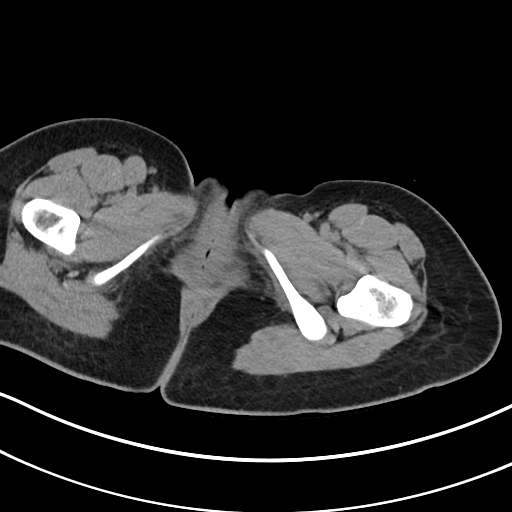
[im 5/87  bone]
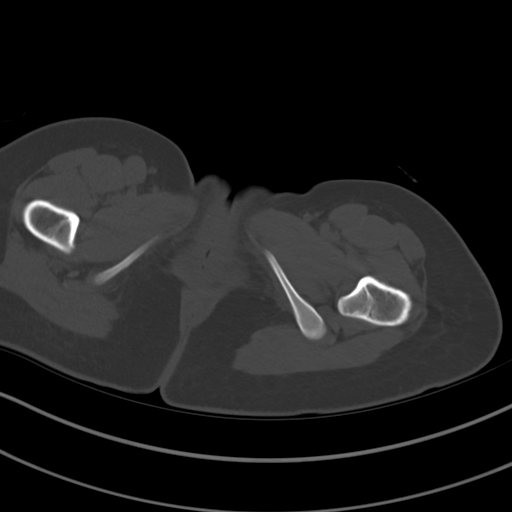
[im 15/87  soft-tissue]
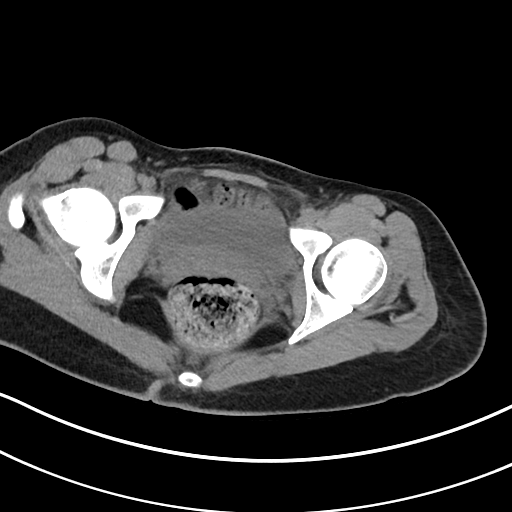
[im 20/87  soft-tissue]
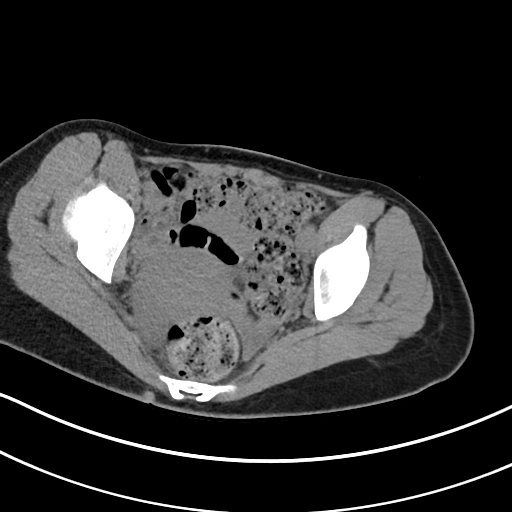
[im 24/87  soft-tissue]
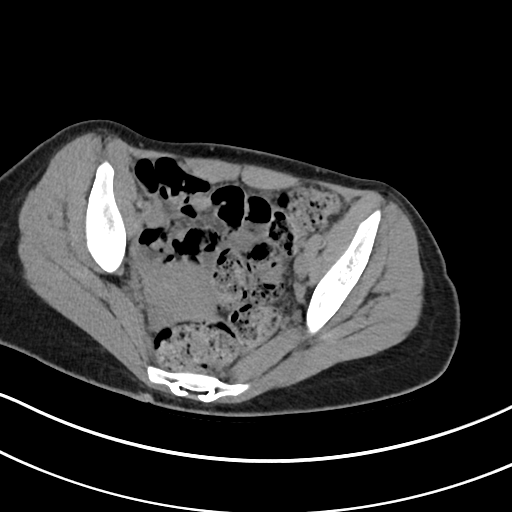
[im 34/87  soft-tissue]
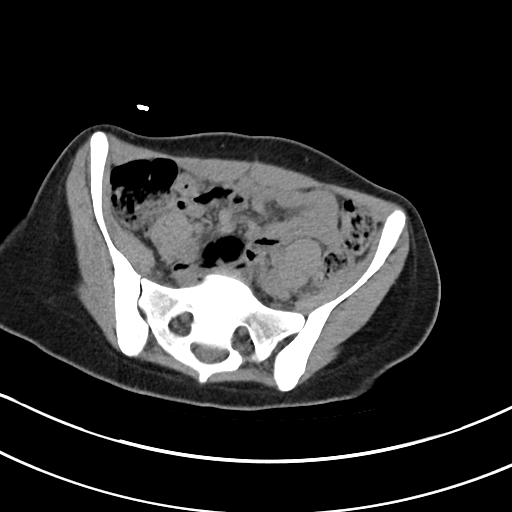
[im 39/87  soft-tissue]
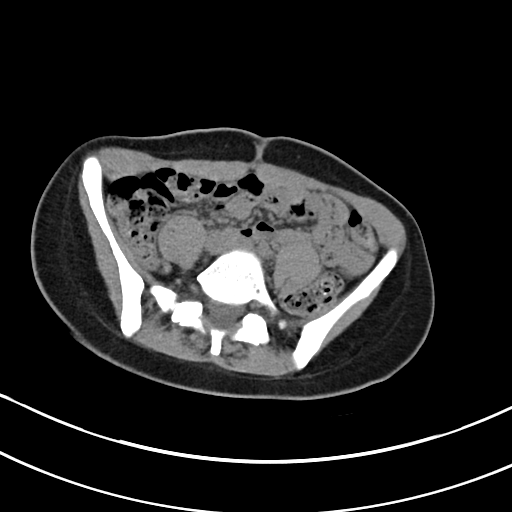
[im 48/87  soft-tissue]
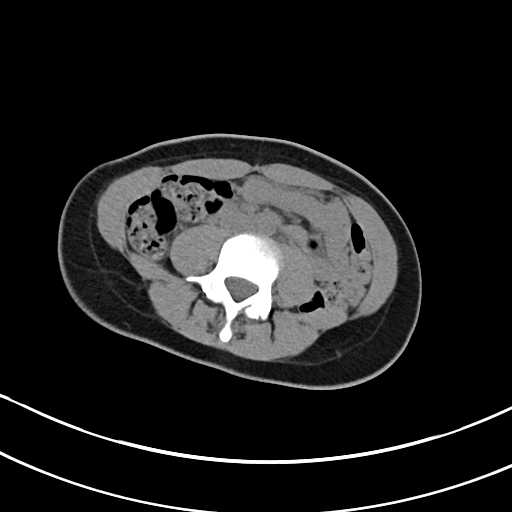
[im 53/87  soft-tissue]
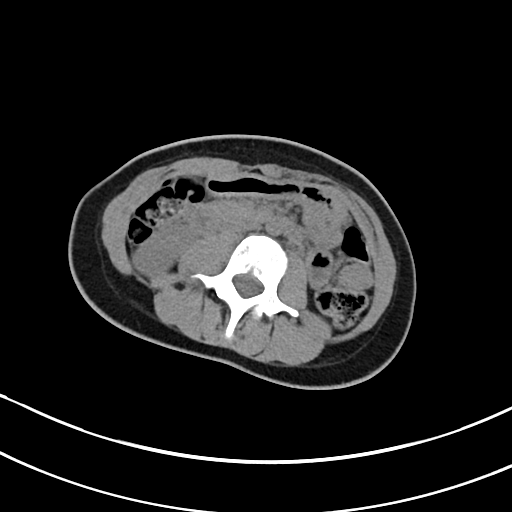
[im 63/87  soft-tissue]
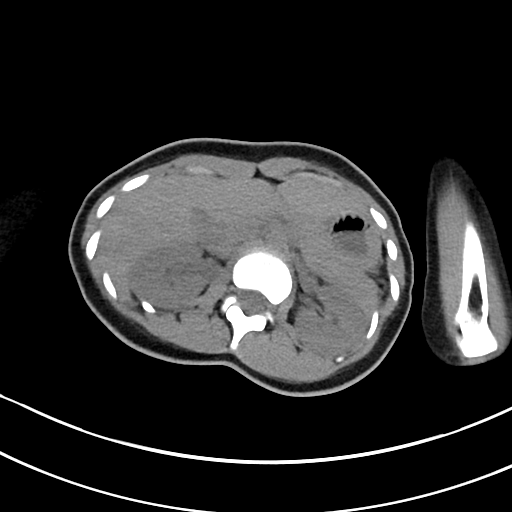
[im 63/87  bone]
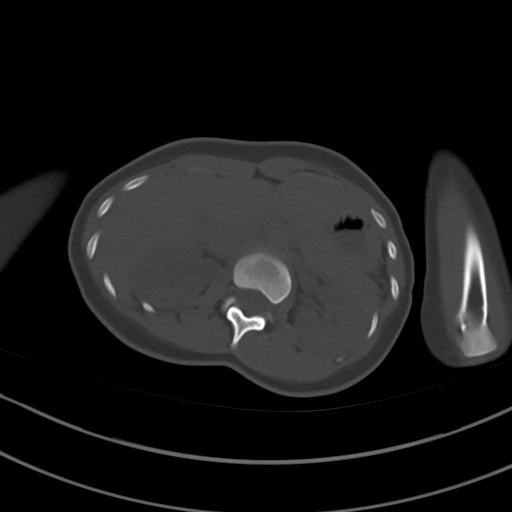
[im 67/87  soft-tissue]
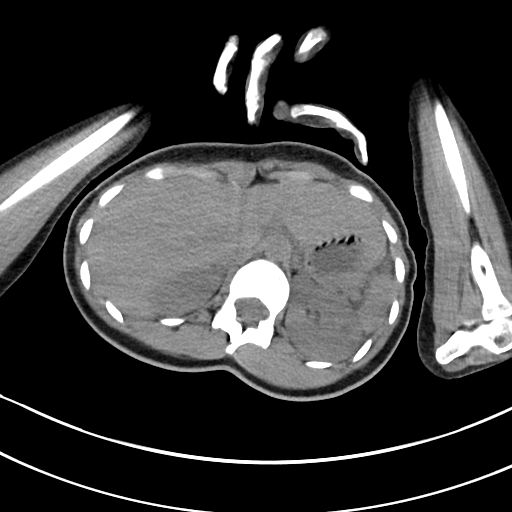
[im 72/87  soft-tissue]
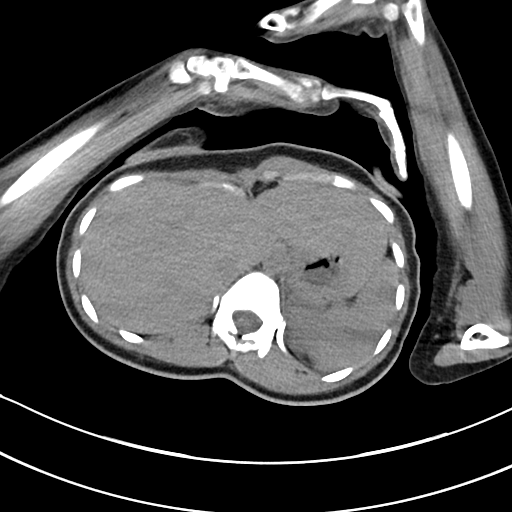
[im 82/87  soft-tissue]
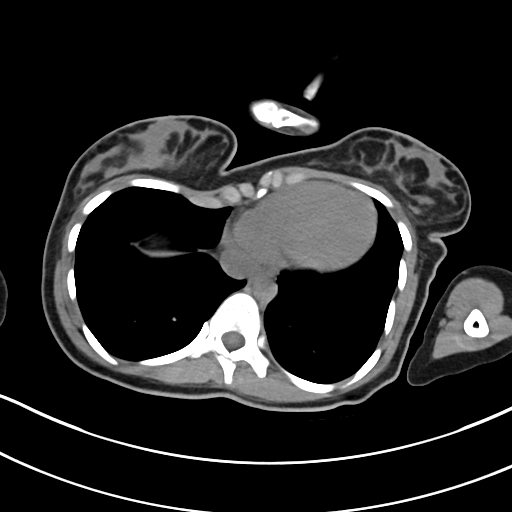

[Series 5: cor · coronal · 0.53mm/px · 3 of 67 slices shown]
[im 23/67  soft-tissue]
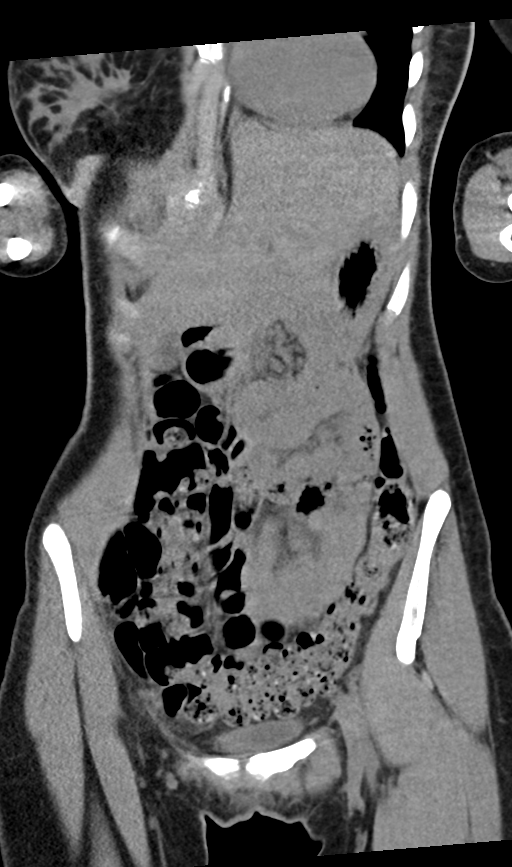
[im 30/67  soft-tissue]
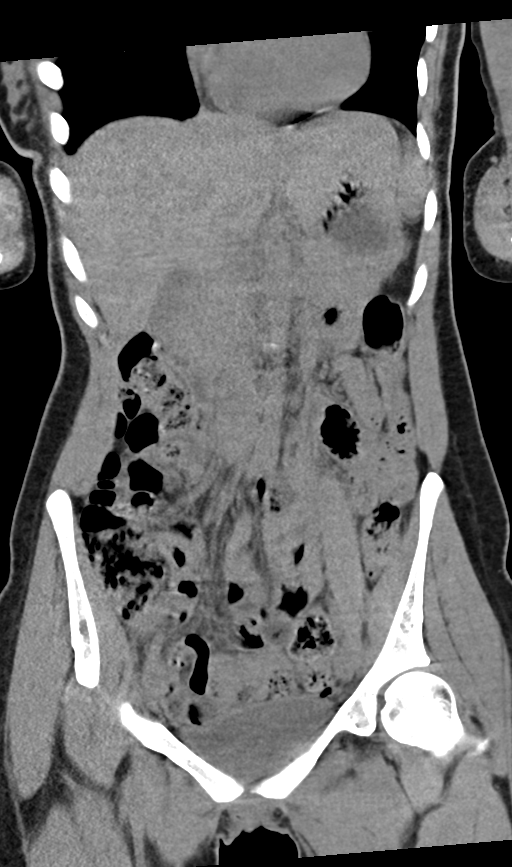
[im 37/67  soft-tissue]
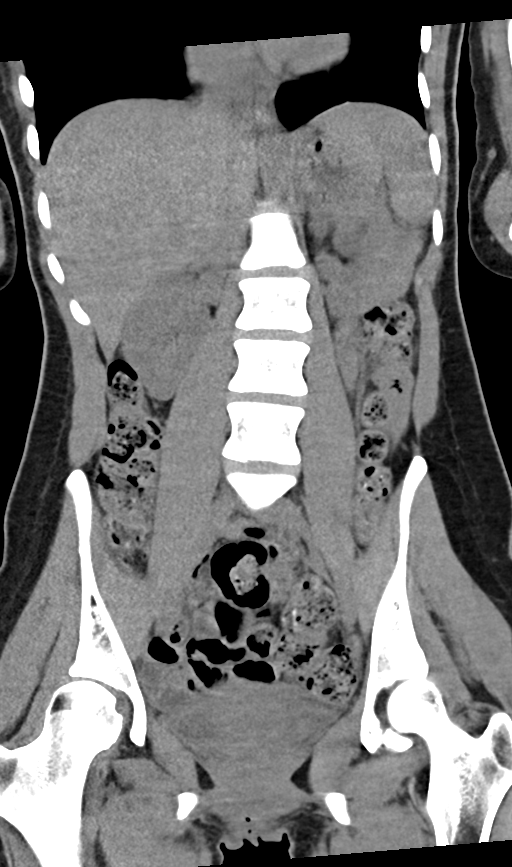

[15 of 46 positions shown; findings below may reference images not displayed]

FINDINGS: Lower chest: Mild distortion of the chest due to levoconvex
scoliosis. Clear lung bases. Normal sized heart. No chest wall or
breast abnormalities visualized.

Hepatobiliary: No focal liver abnormality is seen. No gallstones,
gallbladder wall thickening, or biliary dilatation.

Pancreas: Unremarkable. No pancreatic ductal dilatation or
surrounding inflammatory changes.

Spleen: Normal in size without focal abnormality.

Adrenals/Urinary Tract: Adrenal glands are unremarkable. Kidneys are
normal, without renal calculi, focal lesion, or hydronephrosis.
Bladder is unremarkable.

Stomach/Bowel: Unremarkable stomach, small bowel and colon. No
evidence of appendicitis. Mildly prominent stool in the colon, most
pronounced in the rectum.

Vascular/Lymphatic: No significant vascular findings are present. No
enlarged abdominal or pelvic lymph nodes.

Reproductive: Uterus and bilateral adnexa are unremarkable.

Other: No abdominal wall hernia or abnormality. No abdominopelvic
ascites.

Musculoskeletal: Mild to moderate levoconvex thoracolumbar
scoliosis. Otherwise, normal appearing bones. No rib abnormalities
are visualized.
IMPRESSION: 1. No acute abnormality.
2. Mild to moderate levoconvex thoracolumbar scoliosis.
3. Mildly prominent stool in the colon, most pronounced in the
rectum.

## 2020-10-22 ENCOUNTER — Telehealth: Payer: Self-pay | Admitting: Pediatrics

## 2020-10-22 NOTE — Telephone Encounter (Signed)
Mom is requesting a call back [phone]

## 2020-10-24 ENCOUNTER — Ambulatory Visit (INDEPENDENT_AMBULATORY_CARE_PROVIDER_SITE_OTHER): Payer: Medicaid Other | Admitting: Pediatrics
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
## 2020-10-30 ENCOUNTER — Ambulatory Visit (INDEPENDENT_AMBULATORY_CARE_PROVIDER_SITE_OTHER): Payer: Medicaid Other | Admitting: Pediatrics

## 2020-11-04 ENCOUNTER — Encounter (INDEPENDENT_AMBULATORY_CARE_PROVIDER_SITE_OTHER): Payer: Self-pay | Admitting: Psychology

## 2020-11-20 ENCOUNTER — Ambulatory Visit (INDEPENDENT_AMBULATORY_CARE_PROVIDER_SITE_OTHER): Payer: Medicaid Other | Admitting: Pediatrics

## 2020-12-01 ENCOUNTER — Other Ambulatory Visit (INDEPENDENT_AMBULATORY_CARE_PROVIDER_SITE_OTHER): Payer: Self-pay | Admitting: Pediatrics

## 2020-12-04 ENCOUNTER — Other Ambulatory Visit (INDEPENDENT_AMBULATORY_CARE_PROVIDER_SITE_OTHER): Payer: Self-pay | Admitting: Pediatrics

## 2020-12-19 ENCOUNTER — Ambulatory Visit (INDEPENDENT_AMBULATORY_CARE_PROVIDER_SITE_OTHER): Payer: Medicaid Other | Admitting: Pediatrics

## 2021-01-01 ENCOUNTER — Other Ambulatory Visit (INDEPENDENT_AMBULATORY_CARE_PROVIDER_SITE_OTHER): Payer: Self-pay | Admitting: Family
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
## 2021-01-26 ENCOUNTER — Ambulatory Visit (INDEPENDENT_AMBULATORY_CARE_PROVIDER_SITE_OTHER): Payer: Medicaid Other | Admitting: Pediatrics

## 2021-02-23 ENCOUNTER — Other Ambulatory Visit (INDEPENDENT_AMBULATORY_CARE_PROVIDER_SITE_OTHER): Payer: Self-pay | Admitting: Pediatrics

## 2021-05-09 ENCOUNTER — Other Ambulatory Visit (INDEPENDENT_AMBULATORY_CARE_PROVIDER_SITE_OTHER): Payer: Self-pay | Admitting: Pediatrics

## 2021-05-12 NOTE — Telephone Encounter (Signed)
Called and LVM for mother to discuss No shows as well as let her know that we can not fill RX request without Zariah coming in for an appt.
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
## 2021-07-02 ENCOUNTER — Other Ambulatory Visit: Payer: Self-pay

## 2021-07-02 ENCOUNTER — Ambulatory Visit (INDEPENDENT_AMBULATORY_CARE_PROVIDER_SITE_OTHER): Payer: Medicaid Other

## 2021-07-02 ENCOUNTER — Ambulatory Visit (INDEPENDENT_AMBULATORY_CARE_PROVIDER_SITE_OTHER): Payer: Medicaid Other | Admitting: Pediatrics

## 2021-07-02 ENCOUNTER — Encounter (INDEPENDENT_AMBULATORY_CARE_PROVIDER_SITE_OTHER): Payer: Self-pay | Admitting: Pediatrics

## 2021-07-02 ENCOUNTER — Other Ambulatory Visit (INDEPENDENT_AMBULATORY_CARE_PROVIDER_SITE_OTHER): Payer: Self-pay

## 2021-07-02 VITALS — Wt 124.0 lb

## 2021-07-02 DIAGNOSIS — Z09 Encounter for follow-up examination after completed treatment for conditions other than malignant neoplasm: Secondary | ICD-10-CM

## 2021-07-02 DIAGNOSIS — F902 Attention-deficit hyperactivity disorder, combined type: Secondary | ICD-10-CM | POA: Diagnosis not present

## 2021-07-02 DIAGNOSIS — G479 Sleep disorder, unspecified: Secondary | ICD-10-CM | POA: Diagnosis not present

## 2021-07-02 DIAGNOSIS — R4689 Other symptoms and signs involving appearance and behavior: Secondary | ICD-10-CM | POA: Diagnosis not present

## 2021-07-02 DIAGNOSIS — R32 Unspecified urinary incontinence: Secondary | ICD-10-CM

## 2021-07-02 DIAGNOSIS — Q043 Other reduction deformities of brain: Secondary | ICD-10-CM | POA: Diagnosis not present

## 2021-07-02 DIAGNOSIS — G9349 Other encephalopathy: Secondary | ICD-10-CM

## 2021-07-02 DIAGNOSIS — R159 Full incontinence of feces: Secondary | ICD-10-CM

## 2021-07-02 DIAGNOSIS — L508 Other urticaria: Secondary | ICD-10-CM

## 2021-07-02 MED ORDER — FLUOXETINE HCL 40 MG PO CAPS: ORAL_CAPSULE | ORAL | 3 refills | Status: DC

## 2021-07-02 MED ORDER — ARIPIPRAZOLE 10 MG PO TABS: ORAL_TABLET | ORAL | 3 refills | Status: DC

## 2021-07-02 MED ORDER — ARIPIPRAZOLE 2 MG PO TABS: 2.0000 mg | ORAL_TABLET | Freq: Two times a day (BID) | ORAL | 3 refills | Status: DC | PRN

## 2021-07-02 MED ORDER — GABAPENTIN 300 MG PO CAPS: ORAL_CAPSULE | ORAL | 3 refills | Status: DC

## 2021-07-02 MED ORDER — GUANFACINE HCL ER 1 MG PO TB24: ORAL_TABLET | ORAL | 3 refills | Status: DC

## 2021-07-02 MED ORDER — CYPROHEPTADINE HCL 4 MG PO TABS: ORAL_TABLET | ORAL | 3 refills | Status: DC

## 2021-07-02 NOTE — Patient Instructions (Addendum)
Refilled guanfacine, gabapentin, Prozac, cyproheptadine, and Abilify. ?Referred for physical therapy who will evaluate her for a new bed. They work with you to find the best bed.  ?Placed an order for a helmet.  ?Call to schedule appointments with Dr. Marina Goodell who can help with medication for her periods and with Dr. [name] her general doctor.  ? ?[facility] ?[address] [place]. [address] ?[place],  [place]  [address] ?Phone: [phone]  ?

## 2021-07-02 NOTE — Telephone Encounter (Signed)
Mom and patient came in for appointment with Dr. [name] today and requested a refill for medications that were not prescribed by our office, but by the patient's PCP. This medical assistant informed mom as Dr. [name] is not the prescribing physician we would be unable to fulfill that request, but we could inform PCP that refills are required and they can move forward from there. Mom states understanding and gratitude.  ? ?Mom needs a refill for; ? Ferosul 325 (65 FE) MG tablet ? Melatonin 5mg  TABS ? Hydrocortisone 2.5% ointment.  ?Confirmed pharmacy on file [facility] [address] [facility] is the preferred pharmacy.  ? ?
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
## 2021-07-03 ENCOUNTER — Telehealth (INDEPENDENT_AMBULATORY_CARE_PROVIDER_SITE_OTHER): Payer: Self-pay | Admitting: Pediatrics

## 2021-07-03 MED ORDER — MELATONIN 5 MG PO TABS: 10.0000 mg | ORAL_TABLET | Freq: Every day | ORAL | 3 refills | Status: DC

## 2021-07-03 MED ORDER — HYDROCORTISONE 2.5 % EX OINT: TOPICAL_OINTMENT | Freq: Two times a day (BID) | CUTANEOUS | 1 refills | Status: DC | PRN

## 2021-07-03 NOTE — Telephone Encounter (Signed)
Who's calling (name and relationship to patient) : ?[name] ? ?Best contact number: ?[phone] ? ?Provider they see: ?Dr. Rogers Blocker ? ?Reason for call: ?Would like to speak with Dr. Shelby Mattocks nurse ? ?Call ID:  ? ? ? ? ?PRESCRIPTION REFILL ONLY ? ?Name of prescription: ? ?Pharmacy: ? ? ? ? ? ?

## 2021-07-03 NOTE — Telephone Encounter (Signed)
Patient does not need iron supplement based on her last CBC ?

## 2021-07-06 ENCOUNTER — Telehealth: Payer: Self-pay

## 2021-07-06 DIAGNOSIS — Z09 Encounter for follow-up examination after completed treatment for conditions other than malignant neoplasm: Secondary | ICD-10-CM

## 2021-07-06 NOTE — Telephone Encounter (Signed)
SWCM called mother regarding need for pt to transition to an adult primary care provider. Mother stated that neurologist informed her that pt needs to see current PCP (Dr. [name]) one more time, and then she will be transitioning to adult PCP. Mother asked for [facility] to schedule appt, SWCM informed mother that she would need to discuss with front office staff and Dr. [name] to see if they will allow last office visit appt. Adolescent Transition letter also sent today with recommendation for Internal Medicine as to have pt records transfer over easily.  ? ? ?Kenn File, BSW, QP ?Case Manager ?[facility] ?Office: [phone] ?Direct Number: [phone] ? ?

## 2021-07-07 ENCOUNTER — Telehealth (INDEPENDENT_AMBULATORY_CARE_PROVIDER_SITE_OTHER): Payer: Self-pay

## 2021-07-07 NOTE — Telephone Encounter (Signed)
Call to [facility] on [address] - they report they do not dispense beds to patients over 21. ?Attempted to order the bed on [employer] and the following message received. Reason and diagnosis related to her need for the bed are not supported. Safety is not an option. RN will update MD. Innovations waiver CM notified.  ? ?

## 2021-07-07 NOTE — Telephone Encounter (Signed)
Called mom back and she requests a copy of the ROI form she signed for Sarah at the last visit, agreed to leave that for her at the front desk.  ? ?She also informed that she forgot to ask Dr. [name] at the last visit about wanting referrals to OT and ST as well as the one for PT. She reports these stopped during Covid but she is interested in trying again. If possible she would like them all to be in the same location.  ? ?Informed mom PT had tried to call her, but could not leave a message. Provided the number [phone] for her to call them back.  ?

## 2021-07-08 NOTE — Telephone Encounter (Signed)
Casie has tried multiple therapists which have all not worked out.  Also now that she is an adult, she will only be able to get a short period of therapy, not ongoing.  Mother needs to give [name] a specific therapist she wants [name] to send to, or what specific requests she has for the therapists so we can determine which one will be best.  ? ?Lorenz Coaster MD MPH ?

## 2021-07-09 ENCOUNTER — Telehealth: Payer: Self-pay

## 2021-07-09 NOTE — Addendum Note (Signed)
Addended by: Margurite Auerbach on: 07/09/2021 07:26 PM ? ? Modules accepted: Orders ? ?

## 2021-07-09 NOTE — Addendum Note (Signed)
Addended by: Margurite Auerbach on: 07/09/2021 08:14 PM ? ? Modules accepted: Orders ? ?

## 2021-07-09 NOTE — Telephone Encounter (Signed)
Attempted to call mother to schedule Bryla a follow up appointment with Dr [name] to discuss transitioning Ashlee's care to an adult practice. Requested mother call back to schedule Caylen's appointment with Dr [name].  ?

## 2021-07-13 ENCOUNTER — Telehealth (INDEPENDENT_AMBULATORY_CARE_PROVIDER_SITE_OTHER): Payer: Self-pay | Admitting: Pediatrics

## 2021-07-13 NOTE — Telephone Encounter (Signed)
?  Who's calling (name and relationship to patient) : Samantha; [facility] ? ?Best contact number: ?[phone] ? ?Provider they see: ?Dr. [name] ? ?Reason for call: ?Needing paperwork: office notes and needed supplies sent over. ? ? ?PRESCRIPTION REFILL ONLY ? ?Name of prescription: ? ?Pharmacy: ? ? ?

## 2021-07-13 NOTE — Telephone Encounter (Signed)
?  Who's calling (name and relationship to patient) :[name] ? ?Best contact number: ?[phone] ?Provider they see: ?Rogers Blocker ?Reason for call: ?Please have Dr. Rogers Blocker contact mom after her last patient if possible TODAY, ? ? ? ?PRESCRIPTION REFILL ONLY ? ?Name of prescription: ? ?Pharmacy: ? ? ?

## 2021-07-13 NOTE — Telephone Encounter (Addendum)
Called and spoke with Misty [name]'s mother. Advised mother Dr Derrell Lolling is happy to see Misty [name] for one last appointment to discuss transitioning care to an adult practice. Mother is requesting this visit be Misty [name]'s annual PE as well. Misty [name] is overdue for well visit. Scheduled 45 min appt for Thursday 07/30/21 at 4 pm. Advised mother this visit can be a well visit for Misty [name] and Dr Derrell Lolling will discuss transitioning Misty [name]'s care over to an adult practice also. Mother stated understanding and read back/verified appt date/time.  ?

## 2021-07-13 NOTE — Telephone Encounter (Signed)
Mom clarified that she wants [facility] providers. New referrals placed 07/09/21.   ?

## 2021-07-14 NOTE — Telephone Encounter (Signed)
Spoke with mom and she informs she was following up on the question she asked Ellie to speak with Dr. [name] with. She would not elaborate further.  ?

## 2021-07-14 NOTE — Telephone Encounter (Signed)
Spoke with mom to inform that Dr. [name] agreed to place referrals to [facility] rehab for PT, OT, and ST. Provided: ? ?Address: [address] [place], [place], [place] [address] ?Phone: [phone] ? ?To mom, who agreed to call them to schedule.  ?

## 2021-07-16 NOTE — Addendum Note (Signed)
Addended by: Margurite Auerbach on: 07/16/2021 03:24 PM ? ? Modules accepted: Orders ? ?

## 2021-07-20 ENCOUNTER — Encounter (INDEPENDENT_AMBULATORY_CARE_PROVIDER_SITE_OTHER): Payer: Self-pay | Admitting: Pediatrics

## 2021-07-20 NOTE — Telephone Encounter (Signed)
Note completed and all orders sent including add on therapy referrals.   ? ?Lorenz Coaster MD MPH ?

## 2021-07-22 NOTE — Telephone Encounter (Signed)
Note sent to [facility] to allow coverage of diapers.  ?

## 2021-07-30 ENCOUNTER — Encounter: Payer: Self-pay | Admitting: Pediatrics

## 2021-07-30 ENCOUNTER — Ambulatory Visit (INDEPENDENT_AMBULATORY_CARE_PROVIDER_SITE_OTHER): Payer: Medicaid Other | Admitting: Pediatrics

## 2021-07-30 VITALS — BP 104/62 | HR 104 | Temp 96.8°F | Ht 64.29 in | Wt 124.2 lb

## 2021-07-30 DIAGNOSIS — G479 Sleep disorder, unspecified: Secondary | ICD-10-CM

## 2021-07-30 DIAGNOSIS — F840 Autistic disorder: Secondary | ICD-10-CM | POA: Diagnosis not present

## 2021-07-30 DIAGNOSIS — R32 Unspecified urinary incontinence: Secondary | ICD-10-CM

## 2021-07-30 DIAGNOSIS — Z68.41 Body mass index (BMI) pediatric, 5th percentile to less than 85th percentile for age: Secondary | ICD-10-CM

## 2021-07-30 DIAGNOSIS — K5900 Constipation, unspecified: Secondary | ICD-10-CM

## 2021-07-30 DIAGNOSIS — Q043 Other reduction deformities of brain: Secondary | ICD-10-CM

## 2021-07-30 DIAGNOSIS — Z0000 Encounter for general adult medical examination without abnormal findings: Secondary | ICD-10-CM

## 2021-07-30 DIAGNOSIS — Z00121 Encounter for routine child health examination with abnormal findings: Secondary | ICD-10-CM

## 2021-07-30 DIAGNOSIS — Z0001 Encounter for general adult medical examination with abnormal findings: Secondary | ICD-10-CM

## 2021-07-30 DIAGNOSIS — G9349 Other encephalopathy: Secondary | ICD-10-CM

## 2021-07-30 DIAGNOSIS — Z636 Dependent relative needing care at home: Secondary | ICD-10-CM

## 2021-07-30 DIAGNOSIS — F73 Profound intellectual disabilities: Secondary | ICD-10-CM

## 2021-07-30 MED ORDER — POLYETHYLENE GLYCOL 3350 17 GM/SCOOP PO POWD: 17.0000 g | Freq: Every day | ORAL | 11 refills | Status: DC
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
## 2021-07-30 NOTE — Patient Instructions (Signed)
We will be transferring Misty [name] to an adult practice & will be calling you when the paperwork is completed. ?

## 2021-08-01 ENCOUNTER — Encounter: Payer: Self-pay | Admitting: Pediatrics

## 2021-08-03 ENCOUNTER — Telehealth (INDEPENDENT_AMBULATORY_CARE_PROVIDER_SITE_OTHER): Payer: Self-pay

## 2021-08-03 NOTE — Telephone Encounter (Signed)
RN contacted La[facility] to determine what she and mom decided about the bed.  She said mom did not mention it to her. She will have to have a denial letter before she can try for approval under the waiver program. RN advised it requires a PT eval and mom has never completed the eval in the past. RN advised per computer note on the referral 07/06/21 they tried to call her but mail box was full. They will only call 1 x a month and a max of 3 x's. RN will confirm with Dr. Shelby Mattocks assistant that mom was given the phone number to call to schedule the appointment as well.  ?

## 2021-08-06 ENCOUNTER — Other Ambulatory Visit (INDEPENDENT_AMBULATORY_CARE_PROVIDER_SITE_OTHER): Payer: Self-pay | Admitting: Pediatrics

## 2021-08-06 DIAGNOSIS — F984 Stereotyped movement disorders: Secondary | ICD-10-CM

## 2021-08-06 DIAGNOSIS — F919 Conduct disorder, unspecified: Secondary | ICD-10-CM

## 2021-08-27 ENCOUNTER — Ambulatory Visit (INDEPENDENT_AMBULATORY_CARE_PROVIDER_SITE_OTHER): Payer: Medicaid Other | Admitting: Pediatrics

## 2021-10-15 ENCOUNTER — Other Ambulatory Visit (INDEPENDENT_AMBULATORY_CARE_PROVIDER_SITE_OTHER): Payer: Self-pay | Admitting: Pediatrics

## 2021-11-25 ENCOUNTER — Other Ambulatory Visit (INDEPENDENT_AMBULATORY_CARE_PROVIDER_SITE_OTHER): Payer: Self-pay | Admitting: Pediatrics

## 2021-11-26 ENCOUNTER — Other Ambulatory Visit (INDEPENDENT_AMBULATORY_CARE_PROVIDER_SITE_OTHER): Payer: Self-pay | Admitting: Family

## 2021-11-26 MED ORDER — CYPROHEPTADINE HCL 4 MG PO TABS: ORAL_TABLET | ORAL | 1 refills | Status: DC

## 2021-12-14 ENCOUNTER — Telehealth (INDEPENDENT_AMBULATORY_CARE_PROVIDER_SITE_OTHER): Payer: Self-pay | Admitting: Pediatrics

## 2021-12-14 NOTE — Telephone Encounter (Signed)
[name] brought in papers to be filled out by this evening. She needs these asap for tomorrow and said she will be back by 4:45 this evening.  Contact number is [phone]

## 2021-12-14 NOTE — Telephone Encounter (Signed)
LVM forms cannot be signed by Dr. [name] because she has not been seen in our office since 2021. The orders it is requesting are from her primary: apply sunscreen, treatment for cough, rash etc. Placed forms back at front for her to pick up

## 2021-12-15 ENCOUNTER — Telehealth (INDEPENDENT_AMBULATORY_CARE_PROVIDER_SITE_OTHER): Payer: Self-pay | Admitting: Pediatrics

## 2021-12-15 NOTE — Telephone Encounter (Signed)
Who's calling (name and relationship to patient) : Misty [name]; mom   Best contact number: [phone]  Provider they see: Dr. [name]  Reason for call: Mom has called in wanting to speak with Maralyn Sago  Call ID:      PRESCRIPTION REFILL ONLY  Name of prescription:  Pharmacy:

## 2021-12-25 NOTE — Progress Notes (Signed)
Patient: Misty [name] MRN: [id number] Sex: female DOB: 2001/04/12  Provider: Lorenz Coaster, MD Location of Care: [facility]  Note type: Routine follow-up  History of Present Illness:  Misty [name] is a 21 y.o. female with history of autism, intellectual disability and behavioral difficulty of unclear etiology who I am seeing for routine follow-up. Patient was last seen on 07/02/21 where I continued guanfacine, gabapentin, Prozac, cyproheptadine, and Abilify and referred for PT to evaluate for sleep safe bed and ordered protective helmet.  Since the last appointment, she saw Dr. [name] who referred her to IM for continued primary care management, however, an appointment has not been scheduled.   Patient presents today with her mother who reports her behavior has continued the same. She can occasionally still be aggressive with mom, and she will address this by giving her space and allowing her to rest.  She continues to give guanfacine, fluoxetine, and Abilify every day. She reports that she is not needing an extra dose of Abilify.  Sleep is a little better than before, falls asleep from 9-10 and mostly stays asleep.      She continues to have regular menstrual cycles which can affect her mood, however mom reports she is not interested in birth control as it conflicts with her religous beliefs.   Mom reports that she has not scheduled with a new PCP. She prefers a female provider.   She has continued to be the primary caregiver for Misty [name], and denies having a personal care assitant.   She has received her helmet. However was unable to see PT for assessment for a sleep safe bed. Mom prefers to wait until the next appointment to discuss PT referral again.   Past Medical History Past Medical History:  Diagnosis Date   Behavior concern    MR (mental retardation), moderate    Sleep disorder     Surgical History History reviewed. No  pertinent surgical history.  Family History family history includes Anxiety disorder in her mother; Fibromyalgia in her mother; Hypothyroidism in her sister; Lumbar disc disease in her mother.   Social History Social History   Social History Narrative   Patient graduated from [name]. She does not attend a day program.    Parents are separated or divorced. Mother is primary caregiver.   Significant financial barriers and limited transportation; (mom recently got a new car from insurance co. s/p MVAs x2).    Allergies Allergies  Allergen Reactions   Eggs Or Egg-Derived Products Other (See Comments)    Other reaction(s): HIVES   Other Hives    Chicken nuggets, sausage, beef, vegetable oil    Medications Current Outpatient Medications on File Prior to Visit  Medication Sig Dispense Refill   hydrocortisone 2.5 % ointment Apply topically 2 (two) times daily as needed. For mild urticaria. Do not use for more than 1-2 weeks at a time. 20 g 1   melatonin 5 MG TABS Take 2 tablets (10 mg total) by mouth at bedtime. 60 tablet 3   polyethylene glycol powder (GLYCOLAX/MIRALAX) 17 GM/SCOOP powder Take 17 g by mouth daily. 500 g 11   ferrous sulfate (FEROSUL) 325 (65 FE) MG tablet TAKE 1 TABLET(325 MG) BY MOUTH DAILY 30 tablet 1   No current facility-administered medications on file prior to visit.   The medication list was reviewed and reconciled. All changes or newly prescribed medications were explained.  A complete medication list was provided to  the patient/caregiver.  Physical Exam BP 110/80   Pulse 88   Ht 5' 4.17" (1.63 m)   Wt 126 lb 6.4 oz (57.3 kg)   BMI 21.58 kg/m  Facility age limit for growth %iles is 20 years.  No results found. Gen: well appearing young woman Skin: No rash, No neurocutaneous stigmata. HEENT: Normocephalic, no dysmorphic features, no conjunctival injection, nares patent, mucous membranes moist, oropharynx clear. Neck: Supple, no meningismus. No focal  tenderness. Resp: Clear to auscultation bilaterally CV: Regular rate, normal S1/S2, no murmurs, no rubs Abd: BS present, abdomen soft, non-tender, non-distended. No hepatosplenomegaly or mass Ext: Warm and well-perfused. No deformities, no muscle wasting, ROM full.  Neurological Examination: MS: Awake, alert, interactive. Attends to examiner when addressed directrly, nonverbal. Poor attention in room, spends visit ripping paper.  Cranial Nerves: Pupils were equal and reactive to light;  EOM normal, no nystagmus; no ptsosis, no double vision, intact facial sensation, face symmetric with full strength of facial muscles, hearing intact grossly.  Motor-Normal tone throughout, Normal strength in all muscle groups. No abnormal movements Reflexes- Reflexes 2+ and symmetric in the biceps, triceps, patellar and achilles tendon. Plantar responses flexor bilaterally, no clonus noted Sensation: Intact to light touch throughout.   Coordination: No dysmetria with reaching for objects    Diagnosis: 1. Static encephalopathy   2. Sleep disturbance   3. Attention deficit hyperactivity disorder (ADHD), combined type   4. Aggressive behavior of adolescent   5. Cerebellar hypoplasia (HCC)   6. Disruptive behavior disorder   7. Autism spectrum disorder requiring very substantial support (level 3)      Assessment and Plan Misty [name] is a 21 y.o. female with history of autism, intellectual disability and behavioral difficulty of unclear etiology who I am seeing in follow-up.  Patients behavior remains the same from the last visit, however, can be managed with positive reinforcement. Continued all of her medications today. Mom report she has not established with an adult PCP, advised her on the importance of having someone manage her routine care. Resent referral to IM today, mom prefers to have a female provider and I informed her we would do our best to accommodate her.   - Continued guanfacine,  gabapentin, Prozac, cyproheptadine, and Abilify - Referral to [facility]  - Due to patient's medical condition, patient is indefinitely incontinent of stool and urine.  It is medically necessary for them to use diapers, underpads, and gloves to assist with hygiene and skin integrity.  They require a frequency of up to 200 a month.  I spent 30 minutes on day of service on this patient including review of chart, discussion with patient and family, discussion of screening results, coordination with other providers and management of orders and paperwork.     Return in about 3 months (around 04/01/2022).  I, Mayra Reel, scribed for and in the presence of Lorenz Coaster, MD at today's visit on 12/31/2021.   I, Lorenz Coaster MD MPH, personally performed the services described in this documentation, as scribed by Mayra Reel in my presence on 12/31/2021 and it is accurate, complete, and reviewed by me.    Lorenz Coaster MD MPH Neurology and Neurodevelopment [facility] Neurology  [address] [place], [place], [place] [address] Phone: [phone] Fax: [phone]

## 2021-12-31 ENCOUNTER — Encounter (INDEPENDENT_AMBULATORY_CARE_PROVIDER_SITE_OTHER): Payer: Self-pay | Admitting: Pediatrics

## 2021-12-31 ENCOUNTER — Ambulatory Visit (INDEPENDENT_AMBULATORY_CARE_PROVIDER_SITE_OTHER): Payer: Medicaid Other | Admitting: Pediatrics

## 2021-12-31 VITALS — BP 110/80 | HR 88 | Ht 64.17 in | Wt 126.4 lb

## 2021-12-31 DIAGNOSIS — Q043 Other reduction deformities of brain: Secondary | ICD-10-CM

## 2021-12-31 DIAGNOSIS — F919 Conduct disorder, unspecified: Secondary | ICD-10-CM

## 2021-12-31 DIAGNOSIS — F840 Autistic disorder: Secondary | ICD-10-CM

## 2021-12-31 DIAGNOSIS — F902 Attention-deficit hyperactivity disorder, combined type: Secondary | ICD-10-CM | POA: Diagnosis not present

## 2021-12-31 DIAGNOSIS — R4689 Other symptoms and signs involving appearance and behavior: Secondary | ICD-10-CM

## 2021-12-31 DIAGNOSIS — G479 Sleep disorder, unspecified: Secondary | ICD-10-CM | POA: Diagnosis not present

## 2021-12-31 DIAGNOSIS — G9349 Other encephalopathy: Secondary | ICD-10-CM

## 2021-12-31 MED ORDER — ARIPIPRAZOLE 10 MG PO TABS: ORAL_TABLET | ORAL | 3 refills | Status: DC

## 2021-12-31 MED ORDER — FLUOXETINE HCL 40 MG PO CAPS: ORAL_CAPSULE | ORAL | 3 refills | Status: DC

## 2021-12-31 MED ORDER — GUANFACINE HCL ER 1 MG PO TB24: ORAL_TABLET | ORAL | 3 refills | Status: DC

## 2021-12-31 MED ORDER — GABAPENTIN 300 MG PO CAPS: ORAL_CAPSULE | ORAL | 3 refills | Status: DC

## 2021-12-31 MED ORDER — CYPROHEPTADINE HCL 4 MG PO TABS: ORAL_TABLET | ORAL | 1 refills | Status: DC

## 2021-12-31 NOTE — Patient Instructions (Addendum)
Change her guanfacine to 3 tablets at night.  Stop the lunch time dose of periactin. Only give her this in the morning and at night.  Keep her Abilify, gabapentin, and fluoxetine the same.  I will refer her to [facility] Internal Medicine for her primary care. I will request a lady to be her doctor.

## 2022-01-01 ENCOUNTER — Telehealth (INDEPENDENT_AMBULATORY_CARE_PROVIDER_SITE_OTHER): Payer: Self-pay | Admitting: Pediatrics

## 2022-01-01 NOTE — Telephone Encounter (Signed)
Who's calling (name and relationship to patient) : Misty [name]; mom  Best contact number: [phone]  Provider they see: Dr.Wolfe  Reason for call: Mom has called in wanting to speak with Dr.Wolfe. She has also stated that [name] is needing a new Rx sent in. She is requesting a call back.   Call ID:      PRESCRIPTION REFILL ONLY  Name of prescription:  Pharmacy:

## 2022-01-05 NOTE — Telephone Encounter (Signed)
Contacted pt's mom.  Verified pt's name and DOB as well as mom's name.  I called to see what her concerns were. Pt's mother wanted to she speak with Dr. [name] directly. I asked if I could take a detailed message so that Dr. [name] would know what the call was regarding... mother just wanted to speak with Dr. [name] directly.   I informed the mother that Dr. [name] wouldn't be in office until Thursday. Mother asked if we had any availability earlier than Thursday for an appointment.   I transferred Pt's mother to the front desk to check availability.  SS, CCMA.

## 2022-01-05 NOTE — Telephone Encounter (Signed)
Mom has called in wanting to know if Dr. [name] is in and is wanting to speak with her.

## 2022-01-09 ENCOUNTER — Encounter (INDEPENDENT_AMBULATORY_CARE_PROVIDER_SITE_OTHER): Payer: Self-pay | Admitting: Pediatrics

## 2022-01-11 NOTE — Telephone Encounter (Signed)
I returned mother's call. Mother answered but reported she could not here me due to the phone and to please call back and leave a message.  I called again and left message to please call back with specific question with my staff so I can be able to respond to Shenea's needs. No additional appointment needs to be scheduled unless Lilliam has changes clinically.   Lorenz Coaster MD MPH

## 2022-01-12 ENCOUNTER — Telehealth (INDEPENDENT_AMBULATORY_CARE_PROVIDER_SITE_OTHER): Payer: Self-pay | Admitting: Pediatrics

## 2022-01-12 NOTE — Telephone Encounter (Signed)
Attempted to contact mother to obtain a detailed message.   The phone was answered on the other end but, I was unable to hear anyone on the other end.   Called back and reached the voicemail.    I left a voicemail to please call the office back with a detailed message so that Dr. [name] may understand the reason for the call.  ---------------------------------------------------  Just in good faith, I called back to see if anyone could be reached.  Mom answered the phone. We were both able to hear each other clearly. Verified pt's name and DOB, as well as mothers name.  I informed pt's mom that I was returning a call. Pt's mother is still demanding to speak directly to Dr. [name].   I referred her to the voicemail that was left by Dr. [name] yesterday, 9.11.2023, giving permission for her staff to take a detailed message.   Pt's mom still demanded to speak to Dr. [name] directly. No further information was given.  I informed mom that I would relay the message. She thanked me and ended the call.   SS, CCMA.

## 2022-01-12 NOTE — Telephone Encounter (Signed)
  Name of who is calling: Misty [name]  Caller's Relationship to Patient: Mom  Best contact number: [phone]  Provider they see: [facility]  Reason for call: Mom called to speak with provider and is requesting a callback.     PRESCRIPTION REFILL ONLY  Name of prescription:  Pharmacy:

## 2022-01-18 ENCOUNTER — Other Ambulatory Visit (INDEPENDENT_AMBULATORY_CARE_PROVIDER_SITE_OTHER): Payer: Self-pay | Admitting: Pediatrics

## 2022-01-18 NOTE — Telephone Encounter (Addendum)
I returned call again to mother. She says Misty [name] is doing well.  On further questioning, she reports her brother is a doctor and wants to talk to me but he isn't there.   Mother asking for personal care assistant.  I advised her that I signed orders for PCA services, she will need to speak to [employer].  I provided her the number for [facility] center and told them she needs to call them.    Mother called brother while I was on the phone.  I advised I could not talk about general questions right now, advised he attend the next appointment.  He attempted to give me his phone number, but mother hung up in the middle of the number.  If she calls back, I am willing to get his number and call at another time.    Carylon Perches MD MPH

## 2022-01-19 ENCOUNTER — Telehealth (INDEPENDENT_AMBULATORY_CARE_PROVIDER_SITE_OTHER): Payer: Self-pay | Admitting: Pediatrics

## 2022-01-19 NOTE — Telephone Encounter (Signed)
  Name of who is calling: [name]  Caller's Relationship to Patient: Mom  Best contact number:  [phone]  Provider they see: Dr. Rogers Blocker  Reason for call: Mom called and stated that provider talked to her brother and the call was cut. Her brother's name is Dr. Norval Gable Mom is requesting the clinical staff to give him a callback at [place]  Name of prescription:  Pharmacy:

## 2022-01-20 NOTE — Telephone Encounter (Signed)
Contacted Pt's mom's brother- Dr. Norval Gable.   Returning the requested call back.  Dr. Norval Gable stated that she's doing better and that she appears to be less and less grumpy. Needs less prompting and easily redirected.  Less aggressive compared to the past.    His main concern was Sarahjane's medications and making sure that the directions were relayed to the mother in a way she understood.   Dr. Norval Gable wanted to when was the last time she was seen here in our office and when her next appointment was.  I informed him that though we were given permission to speak to him, I was unable to provide that information because he is not listed in her chart.   I did relay the last message sent by Dr. Rogers Blocker that he is welcome to attend the next appointment with Journe's mom and to just reach out to Lyndel's mom as to when that would be.   Dr. Norval Gable stated that he's seen Likisha 4 times in the past 2 weeks due to family issues. When he goes to see Gillie Manners (Lurlie's sister who is comatose) he evaluates [place] as well.   If Dr. Rogers Blocker has any questions or concerns due to the barriers (Language, Cultural, and Medical) to please give him a call.   [phone]  SS, CCMA

## 2022-01-25 ENCOUNTER — Telehealth (INDEPENDENT_AMBULATORY_CARE_PROVIDER_SITE_OTHER): Payer: Self-pay | Admitting: Pediatrics

## 2022-01-25 NOTE — Telephone Encounter (Signed)
  Name of who is calling: [name]  Caller's Relationship to Patient: mom  Best contact number: [phone]  Provider they see: Dr. Rogers Blocker  Reason for call:  mom is asking for a call back today.

## 2022-01-25 NOTE — Telephone Encounter (Signed)
Contacted pt's mother. Verified pt's name and DOB as well as moms name.  Mom asked if we sent the medications to their pharmacy. I asked mom which medications she was referring to. Mom could not proved the name of the medication.   Mom stated that she will call the pharmacy and have them send in a RX request. Mom is requesting a call back from Dr. Rogers Blocker, three way with her brother.   Mom asked what time should she expect a call back? I stated that I could not give a specific date or time.   SS, CCMA

## 2022-02-26 NOTE — Progress Notes (Incomplete)
Patient: Misty [name] MRN: [id number] Sex: female DOB: April 11, 2001  Provider: Carylon Perches, MD Location of Care: [facility]  Note type: Routine follow-up  History of Present Illness:  Misty [name] is a 21 y.o. female with history of autism, intellectual disability and behavioral difficulty of unclear etiology who I am seeing for routine follow-up. Patient was last seen on 12/31/21 where I continued guanfacine, gabapentin, and prozac and [facility] for [facility]CP management.  Since the last appointment, mom has not scheduled with a PCP, Uncle has also reached out regarding Dee's care and provided contact information.   Patient presents today with ***.      Screenings:  Patient History:   Diagnostics:    Past Medical History Past Medical History:  Diagnosis Date   Behavior concern    MR (mental retardation), moderate    Sleep disorder     Surgical History No past surgical history on file.  Family History family history includes Anxiety disorder in her mother; Fibromyalgia in her mother; Hypothyroidism in her sister; Lumbar disc disease in her mother.   Social History Social History   Social History Narrative   Patient graduated from [employer]. She does not attend a day program.    Parents are separated or divorced. Mother is primary caregiver.   Significant financial barriers and limited transportation; (mom recently got a new car from insurance co. s/p MVAs x2).    Allergies Allergies  Allergen Reactions   Eggs Or Egg-Derived Products Other (See Comments)    Other reaction(s): HIVES   Other Hives    Chicken nuggets, sausage, beef, vegetable oil    Medications Current Outpatient Medications on File Prior to Visit  Medication Sig Dispense Refill   ARIPiprazole (ABILIFY) 10 MG tablet TAKE 1 TABLET(10 MG) BY MOUTH AT BEDTIME 90 tablet 3   cyproheptadine (PERIACTIN) 4 MG tablet TAKE 1  TABLET(4 MG) BY MOUTH AT MORNING AND BEDTIME 60 tablet 1   ferrous sulfate (FEROSUL) 325 (65 FE) MG tablet TAKE 1 TABLET(325 MG) BY MOUTH DAILY 30 tablet 3   FLUoxetine (PROZAC) 40 MG capsule TAKE 1 CAPSULE(40 MG) BY MOUTH DAILY 90 capsule 3   gabapentin (NEURONTIN) 300 MG capsule TAKE 3 CAPSULES(900 MG) BY MOUTH AT BEDTIME 180 capsule 3   guanFACINE (INTUNIV) 1 MG TB24 ER tablet 3 TABLETS EVERY NIGHT AT BEDTIME 270 tablet 3   hydrocortisone 2.5 % ointment Apply topically 2 (two) times daily as needed. For mild urticaria. Do not use for more than 1-2 weeks at a time. 20 g 1   melatonin 5 MG TABS Take 2 tablets (10 mg total) by mouth at bedtime. 60 tablet 3   polyethylene glycol powder (GLYCOLAX/MIRALAX) 17 GM/SCOOP powder Take 17 g by mouth daily. 500 g 11   No current facility-administered medications on file prior to visit.   The medication list was reviewed and reconciled. All changes or newly prescribed medications were explained.  A complete medication list was provided to the patient/caregiver.  Physical Exam There were no vitals taken for this visit. Facility age limit for growth %iles is 20 years.  No results found.  ***   Diagnosis:No diagnosis found.   Assessment and Plan Misty [name] is a 21 y.o. female with history of autism, intellectual disability and behavioral difficulty of unclear etiology who I am seeing in follow-up.   I spent *** minutes on day of service on this patient including  review of chart, discussion with patient and family, discussion of screening results, coordination with other providers and management of orders and paperwork.     No follow-ups on file.  I, Mayra Reel, scribed for and in the presence of Lorenz Coaster, MD at today's visit on 03/04/2022.   Lorenz Coaster MD MPH Neurology and Neurodevelopment [facility]  [address] [place], [place], [place] [address] Phone: [phone] Fax: [phone]

## 2022-03-01 NOTE — Telephone Encounter (Addendum)
Patient has scheduled an appointment to see me 03/04/22.  We will have mom sign ROI for Dr Norval Gable and contact him while in the visit to discuss.   Carylon Perches MD MPH

## 2022-03-04 ENCOUNTER — Ambulatory Visit (INDEPENDENT_AMBULATORY_CARE_PROVIDER_SITE_OTHER): Payer: Medicaid Other | Admitting: Pediatrics

## 2022-04-19 ENCOUNTER — Ambulatory Visit (INDEPENDENT_AMBULATORY_CARE_PROVIDER_SITE_OTHER): Payer: Medicaid Other | Admitting: Pediatrics

## 2022-05-11 NOTE — Telephone Encounter (Signed)
ERROR. CLOSED.  

## 2022-11-05 ENCOUNTER — Ambulatory Visit: Payer: Self-pay | Admitting: Internal Medicine

## 2022-11-05 NOTE — Progress Notes (Deleted)
Pap HPV vaccines Birth control

## 2022-11-11 ENCOUNTER — Telehealth (INDEPENDENT_AMBULATORY_CARE_PROVIDER_SITE_OTHER): Payer: Self-pay

## 2022-11-11 NOTE — Telephone Encounter (Signed)
Patient mother came to the office requesting an earlier appointment with Dr. [name] or to speak with Dr. [name]. Per Dr. [name] we offered August 5th at 9:00. After discussion patient mother accepted appointment.  Patient mother requested for Dr. [name] to be aware that "Someone was going to take [place]". When I tried to get more information she stated she insinuated DSS but couldn't confirm or give more information. Mother stated she got a letter.  She also wanted Dr. [name] to know that the patient is also having new behavioral problems.   I let the mother know I would inform Dr. [name] of these things.

## 2022-12-03 NOTE — Telephone Encounter (Signed)
Patients mother called in requesting to reschedule the Aug 5th appt due to transportation conflict issues, the patient was rescheduled to 8/12 at 10am. The parent's preference was a Thursday at 1:30. As that would have been outside Dr. Blair Heys schedule I offered to send a note to ask for an exception. She strongly insisted no note be sent.

## 2022-12-06 ENCOUNTER — Ambulatory Visit (INDEPENDENT_AMBULATORY_CARE_PROVIDER_SITE_OTHER): Payer: Medicaid Other | Admitting: Pediatrics

## 2022-12-06 NOTE — Telephone Encounter (Signed)
I'm looking at my schedule and I can't do 1:30 on a Thursday, but I can add her on at 12pm this Thursday 8/8.  Please call mom to confirm and if that works for them, Hilda Lias (our new complex care patient care coordinator) can add the appointment because I don't think you have those privileges yet.   Thanks,  Lorenz Coaster MD MPH

## 2022-12-07 NOTE — Telephone Encounter (Signed)
Called patient and left VM informing them of the possible 8/8 appt.

## 2022-12-08 NOTE — Telephone Encounter (Signed)
Patient called back and rejected 8/8 appt, kept 8/12 at 10

## 2022-12-13 ENCOUNTER — Telehealth (INDEPENDENT_AMBULATORY_CARE_PROVIDER_SITE_OTHER): Payer: Self-pay | Admitting: Pediatrics

## 2022-12-13 ENCOUNTER — Ambulatory Visit (INDEPENDENT_AMBULATORY_CARE_PROVIDER_SITE_OTHER): Payer: MEDICAID | Admitting: Pediatrics

## 2022-12-13 ENCOUNTER — Encounter (INDEPENDENT_AMBULATORY_CARE_PROVIDER_SITE_OTHER): Payer: Self-pay | Admitting: Pediatrics

## 2022-12-13 VITALS — BP 110/70 | HR 100 | Wt 151.2 lb

## 2022-12-13 DIAGNOSIS — R4689 Other symptoms and signs involving appearance and behavior: Secondary | ICD-10-CM

## 2022-12-13 DIAGNOSIS — G479 Sleep disorder, unspecified: Secondary | ICD-10-CM | POA: Diagnosis not present

## 2022-12-13 DIAGNOSIS — F840 Autistic disorder: Secondary | ICD-10-CM

## 2022-12-13 DIAGNOSIS — F902 Attention-deficit hyperactivity disorder, combined type: Secondary | ICD-10-CM

## 2022-12-13 DIAGNOSIS — G9349 Other encephalopathy: Secondary | ICD-10-CM

## 2022-12-13 MED ORDER — MELATONIN 3 MG PO TABS: 10.0000 mg | ORAL_TABLET | Freq: Every day | ORAL | Status: DC

## 2022-12-13 MED ORDER — ARIPIPRAZOLE 2 MG PO TABS: 2.0000 mg | ORAL_TABLET | Freq: Every day | ORAL | 3 refills | Status: DC | PRN

## 2022-12-13 NOTE — Telephone Encounter (Signed)
I called mother after I talked to brother, but had not yet charted this. I communicated to mother that I had talked to brother as well as Veterinary surgeon.  They both someone come out to see them today to make sure Zaylia is safe.  Mother initially confused, asked repeatedly how it would work and when they would come.  I explained I haven't called yet but that I would and they would likely come in the next few days.  We will still call [employer] for more information on placement or PCS services, including services provided through her innovations waiver.   I returned call. Mother says [name] Schaumann is safe and does not need to be placed in a home. I asked to speak with father about this decision because he is also her parent. She says he is not allowed to talk to me. Mother asked that if something happens to her and if she is unable to care for Bobbye, what would happen.  I advised she would go under the care of another family member. This can include her father, sister, or uncle.  She wants a DNA test to prove that father is indeed is the father. I advised that if he is listed on the birth certificate, he is legally her father.  Mother consented to give father's information in medical record.  Father's number is [phone] (international number through whatsapp).  After she gave this number, it became clear that father was there with the mother and I asked to talk to father directly.   After some time, female got on the phone that identified himself as Buffi's father.  He reports his name is Mehari.  Father reports that Brealynn is safe. Says [name] is confused and anxious.  He has been living in the same home with Chiquetta for almost a year.  When asked about paternity, he says [name] thinks someone on Facebook says Zamyah is not her daughter. [name] took the phone and stated he wants to take her with him to [place].  He got on the phone again and said this is not true.     I instructed him I would need to have  DSS come do a safety check for Shaindy and [name] given the statements she has made today. He accepts.  I advised that [name] should have an evaluation for her mental health and confusion and he agrees.  I instructed him that DSS can help find care for [name] when they come.  He voiced understanding and appreciation.    I then called DSS Adult protective services.  They reported that they only investigate abuse, neglect or exploitation. She recommends I call the police for a safety check.  I called the police non-emergent number with no answer, they recommend calling back during business hours.   Total time on phone with mother today 60 minutes  Lorenz Coaster MD MPH

## 2022-12-13 NOTE — Telephone Encounter (Addendum)
Mother insistent upon leaving visit today that I speak with her brother, Dr Paschal Dopp.  He had previously been speaking with mother during visit but mother would not pass the phone to me.  I had provided time for him to call back and he had not done so before I had to move to the next patient.   I called brother over lunch time.  His name is Catalina Lunger, hospitalist with [employer].  I updated him regarding the circumstances of today.  He reports that he checks on the family occasionally and when he goes, Gwen is calm and seems safe.  Mom tells him the same thing about safety, but won't give details.  Older sister is bedbound due to autoimmune condition (EMDS) and he worries this is a significant stressor. He doesn't know any psychiatric history for mother, [name], but he is also worried about paranoia. He reports father is actually in the states and has been involed in Augustine's care (this is not what mother told me).  He asks that it not be disclosed to mother that he informed us of this, but requests asking mother to contact the father with provider in the room to assist in decision making for Hermal.   I advised that I will be consulting adult protective services regarding Rania.  He agrees with this plan given mother's concern for [name]'s imminent danger, but also to assess mother's mental status and ability to care for Jahlisa.    Lorenz Coaster MD MPH  Total time 11 minutes

## 2022-12-13 NOTE — Telephone Encounter (Signed)
See other telephone message from today.   Lorenz Coaster MD MPH

## 2022-12-13 NOTE — Patient Instructions (Addendum)
We will arrange counseling for you to discuss your safety concerns.  We will contact Misty [name]'s case manager to discuss possible placement or getting personal care services.   No medications restarted today.  Continue melatonin for sleep.   Referral to [facility] internal medicine to establish care with Dr Mayford Knife The information I have  Address: [name] [place], [place] [address] Phone: [phone]  Personal Care Services (PCS) About PCS Personal Care Services [facility]) provides personal care services to individuals residing in a:  Private living arrangement Residential facility licensed by [facility] as an adult care home Combination home as defined in [place]. 131E-101(1a). Group home licensed under Chapter 122C of the General Statutes and under 10A NCAC 27G.5601 as a supervised living facility for two or more adults whose primary diagnosis is mental illness, a developmental disability or substance abuse dependency Eligibility These services benefit individuals who require assistance with activities of daily living (ADLs), including:  Eating Dressing Bathing Toileting Mobility To qualify for PCS, an individual must have a medical condition, disability or cognitive impairment, and demonstrates unmet needs for:  Three of the five ADLs with limited hands-on assistance Two ADLs, one of which requires extensive assistance Two ADLs, one of which requires assistance at the full dependence level PCS program eligibility is determined by an independent assessment conducted by [facility] Medicaid or its designee, and is provided according to an individualized service plan.

## 2022-12-13 NOTE — Telephone Encounter (Signed)
  Name of who is calling:  [name]  Caller's Relationship to Patient: mom  Best contact number: [phone]  Provider they see: [name]  Reason for call: Mom called requesting to talk to Dr [name], would not specify for what. She is requesting a call back.     PRESCRIPTION REFILL ONLY  Name of prescription:  Pharmacy:

## 2022-12-13 NOTE — Telephone Encounter (Signed)
Mother called back, demanding to speak with Dr. [name]. I informed mom that Dr. [name] is in clinic seeing patients and that she would call mom back when she can.   Mom stated that she had the answers to Dr. Blair Heys question that was asked of her this afternoon. Mom stressed that Dr. [name] called her back this afternoon after the appointment and was insistent on speaking with Dr. [name].   I reiterated that Dr. [name] was still seeing patients and that she would call back when she has the chance.  I had to end the call.  SS, CCMA

## 2022-12-13 NOTE — Progress Notes (Signed)
Patient: Misty [name] MRN: [id number] Sex: female DOB: 2000-08-28  Provider: Lorenz Coaster, MD Location of Care: [facility]  Note type: Routine follow-up  History of Present Illness:  Misty [name] is a 22 y.o. female with history of autism, intellectual disability and behavioral difficulty of unclear etiology who I am seeing for routine follow-up. Patient was last seen on 12/31/2021 where I continued guanfacine, gabapentin, Prozac, cyproheptadine, and Abilify and put in a referral to [facility]. I had referred to PT for an evaluation for a sleep-safe bed on 07/02/2021. Since the last appointment, there are no appointments noted in the patient's chart.   Patient presents today with mother who reports the following:   Symptom management:  Mother reports she is off many of her medications.  She has been [place].  Mother unable to tell me when she left and when she got back.  She left right after I saw her.  She was getting help from family. She didn't see a doctor while she was there.  She has been off medications since she left.    She is hitting, pulling air but less.She wakes up sometimes at night.  She gives 3mg  melatonin sometimes.  She falls asleep well.  Wakes 1-2 times per night.  Stays up half an hour, then falls back asleep.  Falls asleep midnight. Wakes up at 10-11am.  No naps during the day.  Still walking around house at night but not as much.  Denies problems with appetitie, no anxiety.  Denies needing help for aggression.    Mothr says she lost her safety belt.  She is trying to open doors to the car. Never got the bed.    Mother wants placement for Misty [name]. Mother reports she is "not safe", however despite extensive discussion and probing, will not tell me why. She reports she wil be hurt, and is worried that [name] is going to be hurt, but doesn't have a particular person she can identify that is a risk to  them.She says that [name] is in the news, the news says she will be taken from the home. There are articles to "support the children". For collateral, requested she call brother. She did this, but wouldn't let me talk to him. Says he will call me back.   Care coordination:  Now has [facility] tailored plan.She says she has spoken to her new case manager, Jiles Garter, but they have not helped with placement.  She has not had any nurse aid or PCS services in the home since I last saw her, mother is paid caregiver.  Mother providing all care.  She requires walks outside during the day for behavior.  Help her get dressed, makes food and feeds her- she will throw the food if given utensils.  Mother bathes her.  Helps with toileting.    She has not established care with PCP   Past Medical History Past Medical History:  Diagnosis Date   Behavior concern    MR (mental retardation), moderate    Sleep disorder     Surgical History History reviewed. No pertinent surgical history.  Family History family history includes Anxiety disorder in her mother; Fibromyalgia in her mother; Hypothyroidism in her sister; Lumbar disc disease in her mother.   Social History Social History   Social History Narrative   Patient graduated from [name]. She does not attend a day program.    Parents are separated or divorced. Mother  is primary caregiver.   Significant financial barriers and limited transportation; (mom recently got a new car from insurance co. s/p MVAs x2).    Allergies Allergies  Allergen Reactions   Egg-Derived Products Other (See Comments)    Other reaction(s): HIVES   Other Hives    Chicken nuggets, sausage, beef, vegetable oil    Medications Current Outpatient Medications on File Prior to Visit  Medication Sig Dispense Refill   polyethylene glycol powder (GLYCOLAX/MIRALAX) 17 GM/SCOOP powder Take 17 g by mouth daily. 500 g 11   ferrous sulfate (FEROSUL) 325 (65 FE) MG tablet TAKE 1  TABLET(325 MG) BY MOUTH DAILY (Patient not taking: Reported on 12/13/2022) 30 tablet 3   FLUoxetine (PROZAC) 40 MG capsule TAKE 1 CAPSULE(40 MG) BY MOUTH DAILY (Patient not taking: Reported on 12/13/2022) 90 capsule 3   gabapentin (NEURONTIN) 300 MG capsule TAKE 3 CAPSULES(900 MG) BY MOUTH AT BEDTIME 180 capsule 3   guanFACINE (INTUNIV) 1 MG TB24 ER tablet 3 TABLETS EVERY NIGHT AT BEDTIME (Patient not taking: Reported on 12/13/2022) 270 tablet 3   hydrocortisone 2.5 % ointment Apply topically 2 (two) times daily as needed. For mild urticaria. Do not use for more than 1-2 weeks at a time. (Patient not taking: Reported on 12/13/2022) 20 g 1   No current facility-administered medications on file prior to visit.   The medication list was reviewed and reconciled. All changes or newly prescribed medications were explained.  A complete medication list was provided to the patient/caregiver.  Physical Exam BP 110/70 (BP Location: Left Arm, Patient Position: Sitting, Cuff Size: Normal)   Pulse 100   Wt 151 lb 3.2 oz (68.6 kg)   LMP 12/01/2022   BMI 25.81 kg/m  Facility age limit for growth %iles is 20 years.  No results found. General: NAD, well nourished neuroaffected young woman.  Appears well cared for and happy.  HEENT: normocephalic, no eye or nose discharge.  MMM  Cardiovascular: warm and well perfused Lungs: Normal work of breathing, no rhonchi or stridor Skin: No birthmarks, no skin breakdown Abdomen: soft, non tender, non distended Extremities: No contractures or edema. Neuro: EOM intact, face symmetric. Moves all extremities equally and at least antigravity. No abnormal movements. Normal gait.   Psych: No aggression in room.  Previously tore up paper but did not do this while in room.   Diagnosis: 1. Attention deficit hyperactivity disorder (ADHD), combined type   2. 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 Aggressive behavior of adolescent   4. Static encephalopathy   5. 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 Aiyannah seems to be doing quite well despite being off of medications, however I am very concerned for mom.  She seems to be having paranoia and delusions, however can not rule out potential risk at home.  I made a plan with mother during the visit, then afterwards spokw with integrative behavioral health supervisor, brother, and finally adult protective services and police for a wellness check.  Please see telephone encounters from the same day.    Our clinic to arrange counseling for mother to discuss your safety concerns.  Our clinic to contact Elodia's case manager to discuss possible placement or getting personal care services.   Mother requesting abilify as needed for agitation.  Otherwise will not restart medications.  Continue melatonin for sleep.   Rereferred to [facility] internal medicine to establish care with Dr Mayford Knife, per mother's request.  Address provided to mother.  Our clinic will work on potentially buying another seat belt cover for safety, however informed mother that we can not continue to buy them, she can not lose them again.   I spent a total of 120 minutes on day of service on this patient including review of chart, discussion with patient and family, coordination with other providers and community services.   Patient scheduled for prompt follow-up 8/29 to ensure safety  Lorenz Coaster MD MPH Neurology and Neurodevelopment [facility] Child Neurology  [address] [place], [place], [place] [address] Phone: [phone] Fax: [phone]

## 2022-12-14 NOTE — Telephone Encounter (Signed)
I contacted non-emergent police services and made a report with the information below for a wellness check. I left my call phone with permission to call if needed.      Lorenz Coaster MD MPH

## 2022-12-14 NOTE — Telephone Encounter (Signed)
I called Misty [name]'s case manager Misty [name] about possible PCS, placement, or day programs. The case manager told me that she was relatively new to Misty [name]'s case but has been in contact with the Misty [name]'s mom about possible services for Misty [name]. The case manager noted there was some communication difficulty possibly due to a language barrier. As recently as two weeks ago, the case manager spoke to Misty [name]'s mom and offered respite services or a possible day program for Misty [name] through the [employer] Waiver. Misty [name]'s mother denied services at that time because she said that she didn't trust anyone with Misty [name]. The case manager agreed to reach back out to mom and offer PCS or a day program again.

## 2022-12-15 ENCOUNTER — Telehealth (INDEPENDENT_AMBULATORY_CARE_PROVIDER_SITE_OTHER): Payer: Self-pay | Admitting: Pediatrics

## 2022-12-15 NOTE — Telephone Encounter (Signed)
Misty [name]'s case manager called me to let me know that she reached out to Misty [name]'s mom about PCS or placement options for Misty [name]. She said that mom told the her that something had happened and that Misty [name] was unsafe but would not elaborate on what had happened. Mom was interested in placement of Misty [name] outside the home. The case manager said that there were two options in [facility]. However, with everything that Misty [name]'s mom was saying, the case manager is going to go see Misty [name] in person. She will keep me updated on any further developments.

## 2022-12-29 NOTE — Progress Notes (Addendum)
Patient: Misty [name] MRN: [id number] Sex: female DOB: [birth year]  Provider: Lorenz Coaster, MD Location of Care: [facility]  Note type: Routine follow-up  History of Present Illness:  Misty [name] is a 22 y.o. 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 Patient was last seen on 12/17/2022 where I discussed counseling for safety concerns, discussed PCS or possible placement, continued melatonin, and referred to [facility].  Since the last appointment, I called Misty [name]'s case manager about PCS or possible placement who told Misty [name] mother did not want placement when she spoke with her. I also contacted non-emergent police line to perform a wellness check.  They reported no active concerned but agree mother had mental illness.  I spoke with Misty [name]'s father who is interested in getting mother psychiatric help and counseling.   Patient presents today with mother who reports the following:   Since last appointment, mother has picked up abilify and given it when she got aggressive, which is a couple times since last appointment.  When she has given it to her, it worked.  Otherwise, Misty [name] is doing well with limited anxiety or aggression during the day, sleeping well.   Mother again reporting concerns for Misty [name]'s placement if she were to not be able to care for her.  Again asking advice regarding placing her now to ensure she is safe.  Mother does voice some concern that she has taken mother from father and this could get her in "trouble", but she can not specify what this means and she denies feeling unsafe with father.  Mother perseverating on logistics of how to ensure I am contacted if something were to happen to her.    Past Medical History Past Medical History:  Diagnosis Date   Behavior concern    MR  (mental retardation), moderate    Sleep disorder     Surgical History History reviewed. No pertinent surgical history.  Family History family history includes Anxiety disorder in her mother; Fibromyalgia in her mother; Hypothyroidism in her sister; Lumbar disc disease in her mother.   Social History Social History   Social History Narrative   Patient graduated from [name]. She does not attend a day program.    Parents are separated or divorced. Mother is primary caregiver.   Significant financial barriers and limited transportation; (mom recently got a new car from insurance co. s/p MVAs x2).    Allergies Allergies  Allergen Reactions   Egg-Derived Products Other (See Comments)    Other reaction(s): HIVES   Other Hives    Chicken nuggets, sausage, beef, vegetable oil    Medications Current Outpatient Medications on File Prior to Visit  Medication Sig Dispense Refill   ARIPiprazole (ABILIFY) 2 MG tablet Take 1 tablet (2 mg total) by mouth daily as needed (aggression). 30 tablet 3   melatonin 3 MG TABS tablet Take 3.5 tablets (10.5 mg total) by mouth at bedtime.     polyethylene glycol powder (GLYCOLAX/MIRALAX) 17 GM/SCOOP powder Take 17 g by mouth daily. 500 g 11   ferrous sulfate (FEROSUL) 325 (65 FE) MG tablet TAKE 1 TABLET(325 MG) BY MOUTH DAILY (Patient not taking: Reported on 12/13/2022) 30 tablet 3   FLUoxetine (PROZAC) 40 MG capsule TAKE 1 CAPSULE(40 MG) BY MOUTH DAILY (Patient not taking: Reported on 12/13/2022) 90 capsule 3   gabapentin (NEURONTIN) 300 MG capsule TAKE  3 CAPSULES(900 MG) BY MOUTH AT BEDTIME (Patient not taking: Reported on 01/03/2023) 180 capsule 3   guanFACINE (INTUNIV) 1 MG TB24 ER tablet 3 TABLETS EVERY NIGHT AT BEDTIME (Patient not taking: Reported on 12/13/2022) 270 tablet 3   hydrocortisone 2.5 % ointment Apply topically 2 (two) times daily as needed. For mild urticaria. Do not use for more than 1-2 weeks at a time. (Patient not taking: Reported on  12/13/2022) 20 g 1   No current facility-administered medications on file prior to visit.   The medication list was reviewed and reconciled. All changes or newly prescribed medications were explained.  A complete medication list was provided to the patient/caregiver.  Physical Exam BP 120/80 (BP Location: Left Arm, Patient Position: Sitting, Cuff Size: Normal)   Pulse 100   Wt 150 lb 3.2 oz (68.1 kg)   LMP 12/21/2022 (Approximate)   BMI 25.64 kg/m  Facility age limit for growth %iles is 20 years.  No results found. General: NAD, well nourished young lady  HEENT: normocephalic, no eye or nose discharge.  MMM  Cardiovascular: warm and well perfused Lungs: Normal work of breathing, no rhonchi or stridor Skin: No birthmarks, no skin breakdown Abdomen: soft, non tender, non distended Extremities: No contractures or edema. Neuro: Awake, alert, minimally verbal but will interact appropriately.  Raises hand threatening to hit when pushed to do what she doesn't want, but responds well to stern and clear instructions to stop. EOM intact, face symmetric. Moves all extremities equally and at least antigravity. No abnormal movements. Wide based gait.    Diagnosis: 1. Static encephalopathy      Assessment and Plan Rachal M Tesfayo [name] is a 22 y.o. female with hcerebellar hypoplasia and istory of autism, intellectual disability and behavioral difficulty of unclear etiology who I am seeing in follow-up. My major concern continues to be mother's mental health.  I reviewed in depth with mother today the options of how to get Misty [name] placed if she liked, and what the process would look like if she gave her up emergently vs worked with [facility] to find placement.  Also discussed steps should mother be unable to care for Misty [name] but clearly and repeatedly also advised that father would become next of kin should this happen.   For Misty [name], continue abilify PRN for agitation.  Advised mother to make  appointment when contacted by Internal medicine team. For mother, provided list of mental health counselors in the area to discuss concerns regarding Misty [name].  Mother requesting counselor in our office, so placed referral although advised counselor is out on maternity leave right now so will be delayed, and when it starts this would only be short-term counseling.  Provided seat belt cover for Manuela to wearr in the car.  This was provided through complex care fund.  I counseled mother this would be the last cover we could provide, will not be able to buy more if she loses this one.  Number for non-emergent police and department of social services provided to mother in the event there is a safety concern.   My business card was provided to mother to give to emergency services or to keep on the refrigerator, should something happen to Geanie.  Due to patient's medical condition, patient is indefinitely incontinent of stool and urine.  It is medically necessary for them to use diapers, underpads, and gloves to assist with hygiene and skin integrity.  They require a frequency of up to 200 a month.   I spent  60 minutes on day of service on this patient including review of chart, discussion with patient and family, discussion of screening results, coordination with other providers and management of orders and paperwork.     Return in about 3 months (around 04/05/2023).  Lorenz Coaster MD MPH Neurology and Neurodevelopment [facility]  [address] [place], [place], [place] [address] Phone: [phone] Fax: [phone]

## 2022-12-30 ENCOUNTER — Ambulatory Visit (INDEPENDENT_AMBULATORY_CARE_PROVIDER_SITE_OTHER): Payer: MEDICAID | Admitting: Pediatrics

## 2023-01-03 ENCOUNTER — Encounter (INDEPENDENT_AMBULATORY_CARE_PROVIDER_SITE_OTHER): Payer: Self-pay | Admitting: Pediatrics

## 2023-01-04 ENCOUNTER — Ambulatory Visit (INDEPENDENT_AMBULATORY_CARE_PROVIDER_SITE_OTHER): Payer: MEDICAID | Admitting: Pediatrics

## 2023-01-04 ENCOUNTER — Encounter (INDEPENDENT_AMBULATORY_CARE_PROVIDER_SITE_OTHER): Payer: Self-pay | Admitting: Pediatrics

## 2023-01-04 VITALS — BP 120/80 | HR 100 | Wt 150.2 lb

## 2023-01-04 DIAGNOSIS — G9349 Other encephalopathy: Secondary | ICD-10-CM

## 2023-01-04 DIAGNOSIS — F840 Autistic disorder: Secondary | ICD-10-CM

## 2023-01-04 DIAGNOSIS — R4689 Other symptoms and signs involving appearance and behavior: Secondary | ICD-10-CM

## 2023-01-04 DIAGNOSIS — F79 Unspecified intellectual disabilities: Secondary | ICD-10-CM | POA: Diagnosis not present

## 2023-01-04 DIAGNOSIS — Q043 Other reduction deformities of brain: Secondary | ICD-10-CM | POA: Diagnosis not present

## 2023-01-04 DIAGNOSIS — R32 Unspecified urinary incontinence: Secondary | ICD-10-CM

## 2023-01-04 NOTE — Patient Instructions (Addendum)
We will provide a referral for Misty [name] with [facility] for counseling We provided a seat belt cover for [name] to wear in the car We provided a list of counselors in [place] for you. You need to call them to make an appointment.  If you feel that [name] is not safe, you can call the non-emergent police line or the Department of Social Services in [place].  Non-emergent police number: [phone] Department of social services number:  [phone]

## 2023-01-10 ENCOUNTER — Telehealth (INDEPENDENT_AMBULATORY_CARE_PROVIDER_SITE_OTHER): Payer: Self-pay | Admitting: Pediatrics

## 2023-01-10 NOTE — Telephone Encounter (Signed)
  Name of who is calling: Tiffany with [employer]   Caller's Relationship to Patient:   Best contact number: [phone] fax #- [phone]  Provider they see: [name]   Reason for call: Called wanting to know if Adler's latest office notes could be faxed over      PRESCRIPTION REFILL ONLY  Name of prescription:  Pharmacy:

## 2023-01-24 ENCOUNTER — Ambulatory Visit (INDEPENDENT_AMBULATORY_CARE_PROVIDER_SITE_OTHER): Payer: Medicaid Other | Admitting: Pediatrics

## 2023-01-24 ENCOUNTER — Encounter (INDEPENDENT_AMBULATORY_CARE_PROVIDER_SITE_OTHER): Payer: Self-pay | Admitting: Pediatrics

## 2023-02-09 NOTE — Progress Notes (Signed)
Patient: Misty [name] MRN: [id number] Sex: female DOB: 12-23-2000  Provider: Lorenz Coaster, MD Location of Care: [facility]  Note type: Routine follow-up  History of Present Illness:  Misty [name] is a 22 y.o. female with history of cerebellar hypoplasia with history of autism, intellectual disability and behavioral difficulty of unclear etiology who I am seeing for routine follow-up. Patient was last seen on 01/04/2023 where I referred to [facility], provided list of counselors in the area, provided seat belt cover, provided my business card and numbers for non-emergent police and department of social service in case something happens to Misty [name], and advised making an appointment with internal medicine, which is scheduled for 02/24/2023.  Since the last appointment, there are no appointments in the patient's chart.   Patient presents today with mother.     Aggression is doing well, using every 2-3 days.  When given, she calms down.  She is sleeping well with melatonin 3mg  daily.  Seat belt cover is working to keep her in her chair in the car.    Reviewed PCP that she will see.  Mother asking about who this is for, who would be seeing.    Mother reporting frequent urination.  Discussed however that she is getting a lot of fluids, not going more than 8 times per day.     Past Medical History Past Medical History:  Diagnosis Date   Behavior concern    MR (mental retardation), moderate    Sleep disorder     Surgical History No past surgical history on file.  Family History family history includes Anxiety disorder in her mother; Fibromyalgia in her mother; Hypothyroidism in her sister; Lumbar disc disease in her mother.   Social History Social History   Social History Narrative   Patient graduated from [name]. She does not attend a day program.    Parents are separated or divorced. Mother is primary caregiver.   Significant  financial barriers and limited transportation; (mom recently got a new car from insurance co. s/p MVAs x2).    Allergies Allergies  Allergen Reactions   Egg-Derived Products Other (See Comments)    Other reaction(s): HIVES   Other Hives    Chicken nuggets, sausage, beef, vegetable oil    Medications Current Outpatient Medications on File Prior to Visit  Medication Sig Dispense Refill   ARIPiprazole (ABILIFY) 2 MG tablet Take 1 tablet (2 mg total) by mouth daily as needed (aggression). 30 tablet 3   ferrous sulfate (FEROSUL) 325 (65 FE) MG tablet TAKE 1 TABLET(325 MG) BY MOUTH DAILY 30 tablet 3   hydrocortisone 2.5 % ointment Apply topically 2 (two) times daily as needed. For mild urticaria. Do not use for more than 1-2 weeks at a time. 20 g 1   polyethylene glycol powder (GLYCOLAX/MIRALAX) 17 GM/SCOOP powder Take 17 g by mouth daily. 500 g 11   No current facility-administered medications on file prior to visit.   The medication list was reviewed and reconciled. All changes or newly prescribed medications were explained.  A complete medication list was provided to the patient/caregiver.  Physical Exam BP 110/72 (BP Location: Left Arm, Patient Position: Sitting, Cuff Size: Normal)   Pulse 68   Wt 154 lb (69.9 kg)   BMI 26.29 kg/m  Facility age limit for growth %iles is 20 years.  No results found. Gen: well appearing child Skin: No rash, No neurocutaneous stigmata. HEENT: Normocephalic, no dysmorphic features, no conjunctival  injection, nares patent, mucous membranes moist, oropharynx clear. Neck: Supple, no meningismus. No focal tenderness. Resp: Clear to auscultation bilaterally CV: Regular rate, normal S1/S2, no murmurs, no rubs Abd: BS present, abdomen soft, non-tender, non-distended. No hepatosplenomegaly or mass Ext: Warm and well-perfused. No deformities, no muscle wasting, ROM full.  Neurological Examination: MS: Awake, alert, interactive. Poor eye contact, answers  pointed questions with 1 word answers, speech was fluent.  Poor attention in room, mostly plays by herself. Cranial Nerves: Pupils were equal and reactive to light;  EOM normal, no nystagmus; no ptsosis, no double vision, intact facial sensation, face symmetric with full strength of facial muscles, hearing intact grossly.  Motor-Normal tone throughout, Normal strength in all muscle groups. No abnormal movements Reflexes- Reflexes 2+ and symmetric in the biceps, triceps, patellar and achilles tendon. Plantar responses flexor bilaterally, no clonus noted Sensation: Intact to light touch throughout.   Coordination: No dysmetria with reaching for objects    Diagnosis: 1. Cerebellar hypoplasia (HCC)   2. 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 Intellectual disability   4. Disruptive behavior disorder      Assessment and Plan Misty [name] Misty [name] is a 22 y.o. female with history of cerebellar hypoplasia with history of autism, intellectual disability and behavioral difficulty of unclear etiology who I am seeing in follow-up. Patient doing well with limited aggression.  Advised mother to continue with current medication regimen of PRN abilify.  Focused on care coordination needs including integrative behavioral health for mother and internal medicine appointment to establish care with PCP.    I spent 35 minutes on day of service on this patient including review of chart, discussion with patient and family, discussion of screening results, coordination with other providers and management of orders and paperwork.     Return in about 6 months (around 08/15/2023).  Lorenz Coaster MD MPH Neurology and Neurodevelopment [facility]  [address] [place], [place], [place] [address] Phone: [phone] Fax: [phone]

## 2023-02-10 ENCOUNTER — Institutional Professional Consult (permissible substitution) (INDEPENDENT_AMBULATORY_CARE_PROVIDER_SITE_OTHER): Payer: Self-pay | Admitting: Licensed Clinical Social Worker

## 2023-02-14 ENCOUNTER — Institutional Professional Consult (permissible substitution) (INDEPENDENT_AMBULATORY_CARE_PROVIDER_SITE_OTHER): Payer: MEDICAID | Admitting: Licensed Clinical Social Worker

## 2023-02-14 ENCOUNTER — Ambulatory Visit (INDEPENDENT_AMBULATORY_CARE_PROVIDER_SITE_OTHER): Payer: MEDICAID | Admitting: Pediatrics

## 2023-02-14 ENCOUNTER — Encounter (INDEPENDENT_AMBULATORY_CARE_PROVIDER_SITE_OTHER): Payer: Self-pay | Admitting: Pediatrics

## 2023-02-14 VITALS — BP 110/72 | HR 68 | Wt 154.0 lb

## 2023-02-14 DIAGNOSIS — G479 Sleep disorder, unspecified: Secondary | ICD-10-CM

## 2023-02-14 DIAGNOSIS — Q043 Other reduction deformities of brain: Secondary | ICD-10-CM | POA: Diagnosis not present

## 2023-02-14 DIAGNOSIS — F919 Conduct disorder, unspecified: Secondary | ICD-10-CM | POA: Diagnosis not present

## 2023-02-14 DIAGNOSIS — F79 Unspecified intellectual disabilities: Secondary | ICD-10-CM | POA: Diagnosis not present

## 2023-02-14 DIAGNOSIS — F840 Autistic disorder: Secondary | ICD-10-CM

## 2023-02-14 MED ORDER — MELATONIN 3 MG PO TABS: 3.0000 mg | ORAL_TABLET | Freq: Every day | ORAL | Status: DC

## 2023-02-14 NOTE — Patient Instructions (Signed)
You have an appointment with Misty [name] with [facility] on this Wednesday, February 16, 2023 at 1:30 pm. The appointment is located at [address] [place], [place] [address]. This is the same building as Dr. Blair Heys appointment. You have an upcoming appointment with [facility] on February 24, 2023 at 2:15 pm at the address: [address] [place], [place] [address]

## 2023-02-16 ENCOUNTER — Institutional Professional Consult (permissible substitution) (INDEPENDENT_AMBULATORY_CARE_PROVIDER_SITE_OTHER): Payer: MEDICAID | Admitting: Licensed Clinical Social Worker

## 2023-02-17 ENCOUNTER — Encounter (INDEPENDENT_AMBULATORY_CARE_PROVIDER_SITE_OTHER): Payer: Self-pay

## 2023-02-17 ENCOUNTER — Institutional Professional Consult (permissible substitution) (INDEPENDENT_AMBULATORY_CARE_PROVIDER_SITE_OTHER): Payer: MEDICAID | Admitting: Licensed Clinical Social Worker

## 2023-02-17 ENCOUNTER — Ambulatory Visit (INDEPENDENT_AMBULATORY_CARE_PROVIDER_SITE_OTHER): Payer: MEDICAID | Admitting: Licensed Clinical Social Worker

## 2023-02-17 DIAGNOSIS — F840 Autistic disorder: Secondary | ICD-10-CM

## 2023-02-17 DIAGNOSIS — Z636 Dependent relative needing care at home: Secondary | ICD-10-CM

## 2023-02-17 NOTE — BH Specialist Note (Signed)
Integrated Behavioral Health Initial In-Person Visit  MRN: [id number] Name: Misty [name]  Number of Integrated Behavioral Health Clinician visits: Initial 1/6 Session Start time:4:16 Session End time: 1700 Total time in minutes: 44 minutes  Types of Service: Family psychotherapy  Interpretor:No.    Subjective: Misty [name] is a 22 y.o. female accompanied by Mother   Patient was referred by Lorenz Coaster, MD for caregiver support counseling.  Patient reports the following symptoms/concerns: Patient is nonverbal so my interactions during today's visit were directly with patients mother. Concerns presented by patients mother were that "they" were going to take her children further asking clinician if she heard on "the news about my children and family." Patients mother further reporting "something happening" to her family and wanting resources for what to do if she and family are in danger. Patient mother did not go into detail as clinician used motivational interviewing to better understand.   Duration of problem: years; Severity of problem: severe  Objective: Mood:  Not applicable to patient  and Affect:  Not applicable to patient- see clinician interaction and observations in concerns and patient response sections Risk of harm to self or others: No plan to harm self or others  Life Context:  Family and Social: Patient resides with mother and father. School/Work: Not applicable Self-Care: Not applicable to patient Life Changes: Unknown  Patient and/or Family's Strengths/Protective Factors: Patients mother brings patient to appointments and is involved in patients care.  Goals Addressed: Patient will: Reduce symptoms of:  Familial stress Increase knowledge and/or ability of: stress reduction and to utilize resources already given to them prior to [facility] session.    Demonstrate ability to: Increase adequate support systems for patient/family by providing  resources for family.   Progress towards Goals: Ongoing  Interventions: Interventions utilized: Motivational Interviewing, Solution-Focused Strategies, Supportive Counseling, Psychoeducation and/or Health Education, and Supportive Reflection  Standardized Assessments completed: Not Needed  Clinician provided patient mother with resources for mental health crisis intervention; Mobile Crisis- [phone]). Clinician gave mother resources for therapy for herself, highlighting [facility] [place] [phone]).   Patient and/or Family Response: Patient is nonverbal so my interactions were directly with her mother during this visit. Patients mother repeatedly reports concerns of "they" coming to get her children. Patient mother further questioned if clinician heard anything in "the news" about "what happened" to her family. Patients mother questioned clinician if she knew of any place for patient to go for placement. Patients mother also questioned if she could call mobile crisis "ahead of time" to come to her home. Clinician continued to explain her role as mental health professional throughout the duration of appointment. Clinician continues to explain that mobile crisis was for mental health emergencies. Other emergencies needed to be directed to emergency services (911).   Patient Centered Plan: Patient is on the following Treatment Plan(s):  Treatment plan of caregiver stress; helping patient mother find resources for her mental health that will benefit the care and attention she is able to give to patient.   Assessment: Patient mother currently experiencing caregiver stress and symptoms related to other mental health concerns.   Patient may benefit from her mothers involvement with mental health services.  Plan: Follow up with behavioral health clinician on : 03/03/2023 Behavioral recommendations: Continued involvement and attendance to appointments with [facility] Referral(s):  Gave patient mother resources for mobile crisis and therapy.   Jill Side, LCSW

## 2023-02-18 NOTE — Patient Instructions (Addendum)
  Mobile Crisis:[phone] (Emergency mental health)  Non-mental health emergency, call 911  Therapy Resources:   [facility] [place]: [phone]   [facility]: [phone]   [facility]: [phone]

## 2023-02-24 ENCOUNTER — Ambulatory Visit: Payer: Medicaid Other | Admitting: Student

## 2023-03-03 ENCOUNTER — Ambulatory Visit (INDEPENDENT_AMBULATORY_CARE_PROVIDER_SITE_OTHER): Payer: MEDICAID | Admitting: Licensed Clinical Social Worker

## 2023-03-03 DIAGNOSIS — Z636 Dependent relative needing care at home: Secondary | ICD-10-CM | POA: Diagnosis not present

## 2023-03-03 DIAGNOSIS — F840 Autistic disorder: Secondary | ICD-10-CM | POA: Diagnosis not present

## 2023-03-03 NOTE — BH Specialist Note (Signed)
Integrated Behavioral Health Follow Up In-Person Visit  MRN: [id number] Name: Misty [name]  Number of Integrated Behavioral Health Clinician visits: Second Visit (2/6)  Session Start time: 1604 Session End time: 1640 Total time in minutes: 34 minutes  Types of Service: Family Therapy  Interpretor:No.   Subjective: Patients mother was present to this appointment without patient.   Patient was referred by Lorenz Coaster, MD for caregiver support counseling.  Subjective: Patient mother reports continued concerns stated in last visit. Patient mother reports she called one of the places for counseling on the list clinician gave her last visit. She reports they stated there was a waitlist. Patients mother reports feeling stressed and having a difficult time sleeping at night because of the stress. She reports she sleeps one to two hours a night.   Duration of initial  problem: years; Severity of problem: moderate  Objective: Mood: Not applicable to patient and Affect: Not applicable to patient Risk of harm to self or others: No plan to harm self or others  Patient and/or Family's Strengths/Protective Factors: Patients mothers involvement in patient care  Goals Addressed: Patient will:  Reduce symptoms of:  Familial stress    Increase knowledge and/or ability of:  stress reduction and to utilize resources already given to them prior to [facility] session.     Demonstrate ability to:  Increase adequate support systems for patient/family by providing resources for family.   Progress towards Goals: Ongoing  Interventions: Interventions utilized:  Motivational Interviewing, Solution-Focused Strategies, Supportive Counseling, Psychoeducation and/or Health Education, and Supportive Reflection Standardized Assessments completed: Not Needed  Clinician encouraged mother to put her name on the waitlist. Clinician also gave mother walk in options. Clinician explained that [facility] [place] as well as [employer] have walk in options. Used psychoeducation about sleep health and how worries and fears can increase with lack of sleep.Continually encouraged patients mother to seek therapy services for her self.   Patient and/or Family Response: Patient mother reports continued worries and concerns at the last visit. She did not go into much detail during this visit , just reporting her worries were the same and that she felt afraid. Patients mother reports because of the stress, she is not able to sleep at night,further reporting she only sleeps 1-2 hours a night. She reports the last time she got a full night of rest was September 27-29th,2024  Patient Centered Plan: Patient is on the following Treatment Plan(s): Treatment plan of caregiver stress; Clinician continued providing resources to patient mother.    Plan: Follow up with behavioral health clinician on : April during joint with Lorenz Coaster, MD Behavioral recommendations: Continued involvement and attendance to appointment with [facility] team at [facility]. Referral(s): Paramedic (LME/Outside Clinic) and Gave patients mother resources for mobile crisis and therapy   Jill Side, LCSW

## 2023-03-04 NOTE — Patient Instructions (Signed)
[facility]:   Address:   [address], [place] [address]   [phone]    Mobile Crisis:[phone] (Emergency mental health)   [facility] [place]: [phone]    [facility]: [phone]    [facility]: [phone] (sliding scale)   Non-mental health emergency, call 911 or [place] police [phone]

## 2023-03-17 ENCOUNTER — Ambulatory Visit: Payer: Self-pay | Admitting: Internal Medicine

## 2023-03-20 ENCOUNTER — Encounter (INDEPENDENT_AMBULATORY_CARE_PROVIDER_SITE_OTHER): Payer: Self-pay | Admitting: Pediatrics

## 2023-08-15 ENCOUNTER — Ambulatory Visit (INDEPENDENT_AMBULATORY_CARE_PROVIDER_SITE_OTHER): Payer: MEDICAID | Admitting: Pediatrics

## 2023-08-18 ENCOUNTER — Ambulatory Visit (INDEPENDENT_AMBULATORY_CARE_PROVIDER_SITE_OTHER): Payer: MEDICAID | Admitting: Licensed Clinical Social Worker

## 2023-08-18 ENCOUNTER — Ambulatory Visit (INDEPENDENT_AMBULATORY_CARE_PROVIDER_SITE_OTHER): Payer: Self-pay | Admitting: Pediatrics

## 2023-08-18 ENCOUNTER — Institutional Professional Consult (permissible substitution) (INDEPENDENT_AMBULATORY_CARE_PROVIDER_SITE_OTHER): Payer: MEDICAID | Admitting: Licensed Clinical Social Worker

## 2023-08-22 ENCOUNTER — Telehealth (INDEPENDENT_AMBULATORY_CARE_PROVIDER_SITE_OTHER): Payer: Self-pay | Admitting: Pediatrics

## 2023-08-22 NOTE — Telephone Encounter (Signed)
 Patient missed appointment with me last week.  I have been encouraging patient to get a PCP and I now have a contact for home based primary care.  Spoke with them and they are willing to take Misty [name].   Mother's number in chart doesn't work, but I found previous number for father.  No answer.  Went to [employer]. Requested father contact our office if he is interested.   Misty Sires MD MPH

## 2024-03-19 ENCOUNTER — Encounter (HOSPITAL_COMMUNITY): Payer: Self-pay | Admitting: Emergency Medicine

## 2024-03-19 ENCOUNTER — Ambulatory Visit (HOSPITAL_COMMUNITY)
Admission: EM | Admit: 2024-03-19 | Discharge: 2024-03-19 | Disposition: A | Discharge status: Home / Self Care | Payer: MEDICAID

## 2024-03-19 DIAGNOSIS — L989 Disorder of the skin and subcutaneous tissue, unspecified: Secondary | ICD-10-CM | POA: Diagnosis not present

## 2024-03-19 NOTE — Discharge Instructions (Signed)
 Overall, her physical exam is reassuring.  It looks like she has a wart at 1 place on her scalp.  If the areas are painful she can take ibuprofen , 600 mg every 8 hours.  If the areas grow or change at all please follow-up with your primary care provider and they can order outpatient imaging as indicated.  Return to clinic for new or urgent symptoms.

## 2024-03-19 NOTE — ED Triage Notes (Signed)
 Mother reports that patient has two areas on her head that are swollen that have been a couple weeks. Denies any injury.

## 2024-03-19 NOTE — ED Notes (Signed)
 Called telephone number listed. Recording st's unable to complete call.   Called in lobby without response

## 2024-03-19 NOTE — ED Provider Notes (Signed)
 [facility]    CSN: [id number] Arrival date & time: 03/19/24  1508      History   Chief Complaint Chief Complaint  Patient presents with   head swelling    HPI Misty [name] is a 23 y.o. female.   Patient brought into clinic by mother over concern of multiple spots to the scalp that have been there for a few weeks.  Mother feels as if the areas will harden and swell and then get soft again.  Has not had any drainage.  Occasionally the area will be painful and mother will give ibuprofen .  Mother denies any injury.  No fevers.  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  Is nonverbal.  Does not have a primary care provider currently.  The history is provided by the patient, medical records and a parent.    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 No pertinent surgical history.  OB History   No obstetric history on file.      Home Medications    Prior to Admission medications   Not on File    Family History Family History  Problem Relation Age of Onset   Anxiety disorder Mother    Fibromyalgia Mother    Lumbar disc disease Mother    Hypothyroidism Sister     Social History Social History   Tobacco Use   Smoking status: Never    Passive exposure: Never   Smokeless tobacco: Never  Substance Use Topics   Alcohol use: Never     Allergies   Egg protein-containing drug products and Other   Review of Systems Review of Systems  Per HPI  Physical Exam Triage Vital Signs ED Triage Vitals  Encounter Vitals Group     BP 03/19/24 1742 111/77     Girls Systolic BP Percentile --      Girls Diastolic BP Percentile --      Boys Systolic BP Percentile --      Boys Diastolic BP Percentile --      Pulse Rate 03/19/24 1742 91     Resp 03/19/24 1742 19     Temp --      Temp src --      SpO2 03/19/24 1742 99 %     Weight --      Height --      Head Circumference --      Peak Flow --  Pain Score 03/19/24 1741 0     Pain Loc --      Pain Education --      Exclude from Growth Chart --    No data found.  Updated Vital Signs BP 111/77 (BP Location: Right Arm)   Pulse 91   Resp 19   LMP 02/27/2024 (Approximate)   SpO2 99%   Visual Acuity Right Eye Distance:   Left Eye Distance:   Bilateral Distance:    Right Eye Near:   Left Eye Near:    Bilateral Near:     Physical Exam Vitals and nursing note reviewed.  Constitutional:      Appearance: Normal appearance.  HENT:     Head: Normocephalic and atraumatic.     Right Ear: External ear normal.     Left Ear: External ear normal.     Nose: Nose normal.     Mouth/Throat:     Mouth: Mucous membranes are moist.  Eyes:     Conjunctiva/sclera: Conjunctivae normal.  Cardiovascular:     Rate and Rhythm: Normal rate.   Pulmonary:     Effort: Pulmonary effort is normal. No respiratory distress.  Skin:    General: Skin is warm and dry.  Neurological:     General: No focal deficit present.     Mental Status: She is alert and oriented to person, place, and time.  Psychiatric:        Mood and Affect: Mood normal.        Behavior: Behavior normal. Behavior is cooperative.      UC Treatments / Results  Labs (all labs ordered are listed, but only abnormal results are displayed) Labs Reviewed - No data to display  EKG   Radiology No results found.  Procedures Procedures (including critical care time)  Medications Ordered in UC Medications - No data to display  Initial Impression / Assessment and Plan / UC Course  I have reviewed the triage vital signs and the nursing notes.  Pertinent labs & imaging results that were available during my care of the patient were reviewed by me and considered in my medical decision making (see chart for details).  Vitals and triage reviewed, patient is hemodynamically stable.  Mother is concerned about 2 areas to the scalp, no obvious swelling, drainage, bruising or trauma.  No obvious cyst.  There is a spot to the top of the scalp that is a 0.5 cm wart.  No evidence of infection.  Unclear etiology to the skin concern.  Does not appear to be bacterial.  Area is not itchy or painful.  Without fever.  Encouraged follow-up with primary care provider for ongoing monitoring of the areas.  Mother did advocate for x-ray, discussed this would not be beneficial as she appears to have soft tissue complaints.  Without bony tenderness or bony growths.  Plan of care, follow-up care return precautions given, encouraged PCP follow-up.    Final Clinical Impressions(s) / UC Diagnoses   Final diagnoses:  Skin problem     Discharge Instructions      Overall, her physical exam is reassuring.  It looks like she has a wart at 1 place on her scalp.  If the areas are  painful she can take ibuprofen , 600 mg every 8 hours.  If the areas grow or change at all please follow-up with your primary care provider and they can order outpatient imaging as indicated.  Return to clinic for new or urgent symptoms.    ED  Prescriptions   None    PDMP not reviewed this encounter.   Dreama, Manasvi Dickard  N, FNP 03/19/24 [phone]

## 2024-03-30 ENCOUNTER — Encounter (HOSPITAL_COMMUNITY): Payer: Self-pay | Admitting: *Deleted

## 2024-03-30 ENCOUNTER — Emergency Department (HOSPITAL_COMMUNITY): Payer: MEDICAID

## 2024-03-30 DIAGNOSIS — M546 Pain in thoracic spine: Secondary | ICD-10-CM | POA: Insufficient documentation

## 2024-03-30 DIAGNOSIS — F840 Autistic disorder: Secondary | ICD-10-CM | POA: Insufficient documentation

## 2024-03-30 DIAGNOSIS — R519 Headache, unspecified: Secondary | ICD-10-CM | POA: Insufficient documentation

## 2024-03-30 LAB — URINALYSIS, W/ REFLEX TO CULTURE (INFECTION SUSPECTED)
Bilirubin Urine: NEGATIVE
Glucose, UA: NEGATIVE mg/dL
Ketones, ur: NEGATIVE mg/dL
Nitrite: NEGATIVE
Protein, ur: NEGATIVE mg/dL
Specific Gravity, Urine: 1.015 (ref 1.005–1.030)
pH: 6 (ref 5.0–8.0)

## 2024-03-30 MED ORDER — DIAZEPAM 5 MG PO TABS
10.0000 mg | ORAL_TABLET | Freq: Once | ORAL | Status: AC
  Administered 2024-03-30: 10 mg via ORAL
  Filled 2024-03-30: qty 2

## 2024-03-30 MED ORDER — MIDAZOLAM HCL (PF) 2 MG/2ML IJ SOLN: 4.0000 mg | Freq: Once | INTRAMUSCULAR | Status: DC

## 2024-03-30 MED ORDER — DIPHENHYDRAMINE HCL 50 MG/ML IJ SOLN
25.0000 mg | Freq: Once | INTRAMUSCULAR | Status: AC
  Administered 2024-03-30: 25 mg via INTRAVENOUS
  Filled 2024-03-30: qty 1

## 2024-03-30 MED ORDER — HALOPERIDOL LACTATE 5 MG/ML IJ SOLN
2.0000 mg | Freq: Once | INTRAMUSCULAR | Status: AC
  Administered 2024-03-30: 2 mg via INTRAMUSCULAR
  Filled 2024-03-30: qty 1

## 2024-03-30 NOTE — ED Notes (Signed)
 Pt appears anxious and not wanting to allow staff to hook her up to monitoring equipment. Able to follow commands in short sentences. Mother at bedside and trying to assist with calming her.

## 2024-03-30 NOTE — ED Notes (Signed)
 Pt continues to be agitated, provided with a sandwich and juice after speaking with provider. Helped calm pt and get her in bed. Staff still unable to get monitoring equipment on her at this time.

## 2024-03-30 NOTE — Discharge Instructions (Signed)
 Follow-up with your doctor next week.  Use Tylenol and/or ibuprofen as needed for pain

## 2024-03-30 NOTE — ED Notes (Addendum)
 The pt cries out when there is an attempt to do anyhting for hjer such as vitals  and she has attempted to remove her id bracelet x 2  she fights at her mother when there is any attempt to control her  she has already scratched the tech as an attempt to get her vitals

## 2024-03-30 NOTE — ED Triage Notes (Signed)
 Patient states that she is having head and back pain that started 3 weeks ago. Denies cold symptoms. Denies fevers.

## 2024-04-03 ENCOUNTER — Encounter (HOSPITAL_COMMUNITY): Payer: Self-pay | Admitting: Emergency Medicine
